# Patient Record
Sex: Male | Born: 1970 | Race: Black or African American | Hispanic: No | Marital: Single | Smoking: Current some day smoker
Health system: Southern US, Community
[De-identification: ages and names within clinical notes are randomized; demographics above are authoritative.]

## PROBLEM LIST (undated history)

## (undated) DIAGNOSIS — I10 Essential (primary) hypertension: Secondary | ICD-10-CM

## (undated) DIAGNOSIS — E7800 Pure hypercholesterolemia, unspecified: Secondary | ICD-10-CM

## (undated) DIAGNOSIS — J45909 Unspecified asthma, uncomplicated: Secondary | ICD-10-CM

## (undated) DIAGNOSIS — E119 Type 2 diabetes mellitus without complications: Secondary | ICD-10-CM

## (undated) DIAGNOSIS — I319 Disease of pericardium, unspecified: Secondary | ICD-10-CM

## (undated) DIAGNOSIS — E079 Disorder of thyroid, unspecified: Secondary | ICD-10-CM

## (undated) HISTORY — PX: VARICOSE VEIN SURGERY: SHX832

---

## 2015-06-23 ENCOUNTER — Encounter (HOSPITAL_BASED_OUTPATIENT_CLINIC_OR_DEPARTMENT_OTHER): Payer: Self-pay

## 2015-06-23 ENCOUNTER — Emergency Department (HOSPITAL_BASED_OUTPATIENT_CLINIC_OR_DEPARTMENT_OTHER)
Admission: EM | Admit: 2015-06-23 | Discharge: 2015-06-23 | Disposition: A | Discharge status: Home / Self Care | Payer: Medicaid - Out of State | Attending: Emergency Medicine | Admitting: Emergency Medicine

## 2015-06-23 ENCOUNTER — Emergency Department (HOSPITAL_BASED_OUTPATIENT_CLINIC_OR_DEPARTMENT_OTHER): Payer: Medicaid - Out of State

## 2015-06-23 DIAGNOSIS — E119 Type 2 diabetes mellitus without complications: Secondary | ICD-10-CM | POA: Diagnosis not present

## 2015-06-23 DIAGNOSIS — J45901 Unspecified asthma with (acute) exacerbation: Secondary | ICD-10-CM | POA: Insufficient documentation

## 2015-06-23 DIAGNOSIS — R0602 Shortness of breath: Secondary | ICD-10-CM | POA: Diagnosis present

## 2015-06-23 DIAGNOSIS — I10 Essential (primary) hypertension: Secondary | ICD-10-CM | POA: Diagnosis not present

## 2015-06-23 DIAGNOSIS — Z880 Allergy status to penicillin: Secondary | ICD-10-CM | POA: Diagnosis not present

## 2015-06-23 DIAGNOSIS — R062 Wheezing: Secondary | ICD-10-CM

## 2015-06-23 HISTORY — DX: Pure hypercholesterolemia, unspecified: E78.00

## 2015-06-23 HISTORY — DX: Essential (primary) hypertension: I10

## 2015-06-23 HISTORY — DX: Type 2 diabetes mellitus without complications: E11.9

## 2015-06-23 HISTORY — DX: Unspecified asthma, uncomplicated: J45.909

## 2015-06-23 HISTORY — DX: Disorder of thyroid, unspecified: E07.9

## 2015-06-23 MED ORDER — CETIRIZINE HCL 10 MG PO CAPS: 1.0000 | ORAL_CAPSULE | Freq: Every day | ORAL | Status: DC

## 2015-06-23 MED ORDER — PREDNISONE 50 MG PO TABS
60.0000 mg | ORAL_TABLET | Freq: Once | ORAL | Status: AC
  Administered 2015-06-23: 60 mg via ORAL
  Filled 2015-06-23 (×2): qty 1

## 2015-06-23 MED ORDER — ALBUTEROL SULFATE (2.5 MG/3ML) 0.083% IN NEBU
INHALATION_SOLUTION | RESPIRATORY_TRACT | Status: AC
  Administered 2015-06-23: 5 mg via RESPIRATORY_TRACT
  Filled 2015-06-23: qty 6

## 2015-06-23 MED ORDER — IPRATROPIUM BROMIDE 0.02 % IN SOLN
RESPIRATORY_TRACT | Status: AC
  Administered 2015-06-23: 0.5 mg via RESPIRATORY_TRACT
  Filled 2015-06-23: qty 2.5

## 2015-06-23 MED ORDER — ALBUTEROL SULFATE HFA 108 (90 BASE) MCG/ACT IN AERS: 1.0000 | INHALATION_SPRAY | Freq: Four times a day (QID) | RESPIRATORY_TRACT | Status: DC | PRN

## 2015-06-23 MED ORDER — FLUTICASONE PROPIONATE HFA 110 MCG/ACT IN AERO: 2.0000 | INHALATION_SPRAY | Freq: Two times a day (BID) | RESPIRATORY_TRACT | Status: DC

## 2015-06-23 MED ORDER — ALBUTEROL SULFATE (2.5 MG/3ML) 0.083% IN NEBU
5.0000 mg | INHALATION_SOLUTION | Freq: Once | RESPIRATORY_TRACT | Status: AC
  Administered 2015-06-23: 5 mg via RESPIRATORY_TRACT
  Filled 2015-06-23: qty 6

## 2015-06-23 MED ORDER — IPRATROPIUM BROMIDE 0.02 % IN SOLN
0.5000 mg | RESPIRATORY_TRACT | Status: AC
  Administered 2015-06-23: 0.5 mg via RESPIRATORY_TRACT

## 2015-06-23 MED ORDER — PREDNISONE 20 MG PO TABS: ORAL_TABLET | ORAL | Status: DC

## 2015-06-23 MED ORDER — ALBUTEROL SULFATE (2.5 MG/3ML) 0.083% IN NEBU
5.0000 mg | INHALATION_SOLUTION | RESPIRATORY_TRACT | Status: AC
  Administered 2015-06-23: 5 mg via RESPIRATORY_TRACT

## 2015-06-23 NOTE — ED Notes (Signed)
Patient transported to X-ray 

## 2015-06-23 NOTE — ED Notes (Signed)
MD at bedside. 

## 2015-06-23 NOTE — Discharge Instructions (Signed)
Asthma, Acute Bronchospasm °Acute bronchospasm caused by asthma is also referred to as an asthma attack. Bronchospasm means your air passages become narrowed. The narrowing is caused by inflammation and tightening of the muscles in the air tubes (bronchi) in your lungs. This can make it hard to breathe or cause you to wheeze and cough. °CAUSES °Possible triggers are: °· Animal dander from the skin, hair, or feathers of animals. °· Dust mites contained in house dust. °· Cockroaches. °· Pollen from trees or grass. °· Mold. °· Cigarette or tobacco smoke. °· Air pollutants such as dust, household cleaners, hair sprays, aerosol sprays, paint fumes, strong chemicals, or strong odors. °· Cold air or weather changes. Cold air may trigger inflammation. Winds increase molds and pollens in the air. °· Strong emotions such as crying or laughing hard. °· Stress. °· Certain medicines such as aspirin or beta-blockers. °· Sulfites in foods and drinks, such as dried fruits and wine. °· Infections or inflammatory conditions, such as a flu, cold, or inflammation of the nasal membranes (rhinitis). °· Gastroesophageal reflux disease (GERD). GERD is a condition where stomach acid backs up into your esophagus. °· Exercise or strenuous activity. °SIGNS AND SYMPTOMS  °· Wheezing. °· Excessive coughing, particularly at night. °· Chest tightness. °· Shortness of breath. °DIAGNOSIS  °Your health care provider will ask you about your medical history and perform a physical exam. A chest X-ray or blood testing may be performed to look for other causes of your symptoms or other conditions that may have triggered your asthma attack.  °TREATMENT  °Treatment is aimed at reducing inflammation and opening up the airways in your lungs.  Most asthma attacks are treated with inhaled medicines. These include quick relief or rescue medicines (such as bronchodilators) and controller medicines (such as inhaled corticosteroids). These medicines are sometimes  given through an inhaler or a nebulizer. Systemic steroid medicine taken by mouth or given through an IV tube also can be used to reduce the inflammation when an attack is moderate or severe. Antibiotic medicines are only used if a bacterial infection is present.  °HOME CARE INSTRUCTIONS  °· Rest. °· Drink plenty of liquids. This helps the mucus to remain thin and be easily coughed up. Only use caffeine in moderation and do not use alcohol until you have recovered from your illness. °· Do not smoke. Avoid being exposed to secondhand smoke. °· You play a critical role in keeping yourself in good health. Avoid exposure to things that cause you to wheeze or to have breathing problems. °· Keep your medicines up-to-date and available. Carefully follow your health care provider's treatment plan. °· Take your medicine exactly as prescribed. °· When pollen or pollution is bad, keep windows closed and use an air conditioner or go to places with air conditioning. °· Asthma requires careful medical care. See your health care provider for a follow-up as advised. If you are more than [redacted] weeks pregnant and you were prescribed any new medicines, let your obstetrician know about the visit and how you are doing. Follow up with your health care provider as directed. °· After you have recovered from your asthma attack, make an appointment with your outpatient doctor to talk about ways to reduce the likelihood of future attacks. If you do not have a doctor who manages your asthma, make an appointment with a primary care doctor to discuss your asthma. °SEEK IMMEDIATE MEDICAL CARE IF:  °· You are getting worse. °· You have trouble breathing. If severe, call your local   emergency services (911 in the [place] °· You develop chest pain or discomfort. °· You are vomiting. °· You are not able to keep fluids down. °· You are coughing up yellow, green, brown, or bloody sputum. °· You have a fever and your symptoms suddenly get worse. °· You have  trouble swallowing. °MAKE SURE YOU:  °· Understand these instructions. °· Will watch your condition. °· Will get help right away if you are not doing well or get worse. °Document Released: 04/01/2007 Document Revised: 12/20/2013 Document Reviewed: 06/22/2013 °ExitCare® Patient Information ©2015 [employer]. This information is not intended to replace advice given to you by your health care provider. Make sure you discuss any questions you have with your health care provider. ° ° °Emergency Department Resource Guide °1) Find a Doctor and Pay Out of Pocket °Although you won't have to find out who is covered by your insurance plan, it is a good idea to ask around and get recommendations. You will then need to call the office and see if the doctor you have chosen will accept you as a new patient and what types of options they offer for patients who are self-pay. Some doctors offer discounts or will set up payment plans for their patients who do not have insurance, but you will need to ask so you aren't surprised when you get to your appointment. ° °2) Contact Your Local Health Department °Not all health departments have doctors that can see patients for sick visits, but many do, so it is worth a call to see if yours does. If you don't know where your local health department is, you can check in your phone book. The CDC also has a tool to help you locate your state's health department, and many state websites also have listings of all of their local health departments. ° °3) Find a Walk-in Clinic °If your illness is not likely to be very severe or complicated, you may want to try a walk in clinic. These are popping up all over the country in pharmacies, drugstores, and shopping centers. They're usually staffed by nurse practitioners or physician assistants that have been trained to treat common illnesses and complaints. They're usually fairly quick and inexpensive. However, if you have serious medical issues or chronic  medical problems, these are probably not your best option. ° °No Primary Care Doctor: °- Call [facility] at  832-8000 - they can help you locate a primary care doctor that  accepts your insurance, provides certain services, etc. °- Physician Referral Service- 1-800-533-3463 ° °Chronic Pain Problems: °Organization         Address  Phone   Notes  °[facility]  (336) 297-2271 Patients need to be referred by their primary care doctor.  ° °Medication Assistance: °Organization         Address  Phone   Notes  °[place] [facility] [address] °[place], [place] [address] (336) 641-8030 --Must be a resident of [place] °-- Must have NO insurance coverage whatsoever (no Medicaid/ Medicare, etc.) °-- The pt. MUST have a primary care doctor that directs their care regularly and follows them in the community °  °[facility]  (866) 331-1348   °[employer]  (888) 892-1162   ° °Agencies that provide inexpensive medical care: °Organization         Address  Phone   Notes  °[facility]  (336) 832-8035   °[facility]    (  336) 832-7272   °[facility] [address] °[place], [place] [address] (336) 832-4777   °[facility] [address], °[place] (336) 271-4999   °[facility]    (336) 373-0678   °[facility]    (336) 272-1050   °[facility] ° [address], [place] Phone:  (336) 832-4444, Fax:  (336) 832-4440 Hours of Operation:  9 am - 6 pm, M-F.  Also accepts Medicaid/Medicare and self-pay.  °[facility] ° [address], [place] Phone: (336) 832-3150, Fax: (336) 832-3151. Hours of Operation:  8:30 am - 5:30 pm, M-F.  Also accepts Medicaid and self-pay.  °[facility] [place] [address], [place] Phone: (336) 878-6027   °[facility] [address], [place], [place] (336)723-1848,  Ext. 123 Mondays & Thursdays: 7-9 AM.  First 15 patients are seen on a first come, first serve basis. °  ° °Medicaid-accepting [place] Providers: ° °Organization         Address  Phone   Notes  °[facility] [address], [place] (336) 641-2100 Also accepts self-pay patients.  °[facility] [address], [place] ° (336) 856-9996   °[facility] [address], [place] (336) 288-8857   °[facility] [address] [place] [address], [place] (336) 299-7000   °Veita Bland [address], [place]  ° (336) 373-1557 Only accepts [place] Access Medicaid patients after they have their name applied to their card.  ° °Self-Pay (no insurance) in [place]: ° °Organization         Address  Phone   Notes  °Sickle Cell Patients, [facility] [address], [place] (336) 832-1970   °[facility] [address], [place] (336) 832-4400   ° [facility] ° [address], [place] (336) 992-4800   °[facility]/Dr. Osei-Bonsu ° [address] [place] [address], [place] or [address], [place] (336) 841-8500 Phone number for both [place] and [place] locations is the same.  °[facility] [address], [place] (336) 299-0000   °[facility] [address] [place] [address],  or [address] (336) 852-7530 °(336) 878-2260   °[facility] [address],  (336) 350-1642, phone; (336) 294-5005, fax Sees patients 1st and 3rd Saturday of every month.  Must not qualify for public or private insurance (i.e. Medicaid, Medicare, [place] Health Choice, Veterans' Benefits) • Household income should be no more than 200% of the poverty level •The clinic cannot treat you if you are pregnant or think you are pregnant • Sexually transmitted diseases are not  treated at the clinic.  ° ° °Dental Care: °Organization         Address  Phone  Notes  °[facility] [address],  (336) 641-6152 Accepts children up to age 21 who are enrolled in Medicaid or [place] Health Choice; pregnant women with a Medicaid card; and children who have applied for Medicaid or [place] Health Choice, but were declined, whose parents can pay a reduced fee at time of service.  °[facility] [place]  [address], [place] (336) 641-7733 Accepts children up   to age 21 who are enrolled in Medicaid or [place] Health Choice; pregnant women with a Medicaid card; and children who have applied for Medicaid or [place] Health Choice, but were declined, whose parents can pay a reduced fee at time of service.  °[facility] ° [address], [place] (336) 641-4533 Patients are seen by appointment only. Walk-ins are not accepted. [facility] will see patients 18 years of age and older. °Monday - Tuesday (8am-5pm) °Most Wednesdays (8:30-5pm) °$30 per visit, cash only  °[facility] ° [address], [place] (336) 641-4533 Patients are seen by appointment only. Walk-ins are not accepted. [facility] will see patients 18 years of age and older. °One Wednesday Evening (Monthly: Volunteer Based).  $30 per visit, cash only  °[facility]  (919) 537-3737 for adults; Children under age 4, call Graduate Pediatric Dentistry at ([phone]) [phone]. Children aged 4-14, please call (919) [phone] to request a pediatric application. ° Dental services are provided in all areas of dental care including fillings, crowns and bridges, complete and partial dentures, implants, gum treatment, root canals, and extractions. Preventive care is also provided. Treatment is provided to both adults and children. °Patients are selected via a lottery and there is  often a waiting list. °  °[facility] [address], °[place] ° (336) 763-8833 [email or web address] °  °[facility] [address], [place], [place] (336)723-1848, Ext. 123 Second and Fourth Thursday of each month, opens at 6:30 AM; Clinic ends at 9 AM.  Patients are seen on a first-come first-served basis, and a limited number are seen during each clinic.  ° °[facility] ° [address], [place], [place] (336) 723-7904   Eligibility Requirements °You must have lived in [place], [place], or [place] counties for at least the last three months. °  You cannot be eligible for state or federal sponsored healthcare insurance, including Veterans Administration, Medicaid, or Medicare. °  You generally cannot be eligible for healthcare insurance through your employer.  °  How to apply: °Eligibility screenings are held every Tuesday and Wednesday afternoon from 1:00 pm until 4:00 pm. You do not need an appointment for the interview!  °[facility] [address], [place], [place] 336-631-2330   °[facility]  336-342-8273   °[facility]  336-703-3100   °[facility]  336-570-6415   ° °Behavioral Health Resources in the Community: °Intensive Outpatient Programs °Organization         Address  Phone  Notes  °[facility] [address], [place], [place] 336-878-6098   °[facility] [address],  [place], [place] 336-832-9800   °[facility] [address], [place], [place] ° 336-882-2125   °[facility] [address],  °[place], [place] 1-800-853-5163 or 336-641-4981   °Substance Abuse Resources °Organization         Address  Phone  Notes  °[facility]  336-882-2125   °[facility]  336-784-9470   °[facility]  336-285-9073   °[facility]  336-845-3988   °Residential & Outpatient Substance  Abuse Program  1-800-659-3381   °Psychological Services °Organization         Address  Phone  Notes  °[facility]  336- 832-9600   °[facility]  336- 378-7881   °[facility]   [address], [place] 1-800-853-5163 or 336-641-4981   ° °Mobile Crisis Teams °Organization         Address  Phone  Notes  °[facility], Mobile Crisis Care Unit  1-877-626-1772   °Assertive °[facility] ° [address] [place], [place] 336-834-9664   °Sharon DeEsch [address] °[place] 336-554-5454   ° °Self-Help/Support Groups °Organization         Address  Phone             Notes  °[facility] - variety of support groups  336- 373-1402 Call for more information  °Narcotics Anonymous (NA), [facility] [address], °[place]  2 meetings at this location  ° °Residential Treatment Programs °Organization         Address  Phone  Notes  °[facility] [address],    °[place]  1-866-801-8205   °[facility] ° [address], [place], [place] 704-293-8524   °[facility] [address], [place] 336-845-3988 Admissions: 8am-3pm M-F  °[facility] [address]    °[place], [place] 336-841-1104   °[facility] [address], [place], [place] 336-379-7146   °[facility] [address]  °[place], [place] 336-285-9073   °[facility] - Intensive Outpatient [address], , [place] 336-852-3033   °[facility] [address]  °[place], [place] 1-877-615-2722 or 336-784-9470   °[facility] [address] [place], [place] 336-227-7417 Accepts Medicaid  °[facility] [address]  ° [place] 1-800-659-3381 Substance Abuse/Addiction Treatment  ° °[place] Behavioral Health Resources °Organization          Address  Phone  Notes  °[facility]  (888) 581-9988   °Julie Brannon, PhD [address] [place], [place]   (336) 349-5553 or (336) 951-0000   °[facility]   [address] °[place], [place] (336) 349-4454   °[facility] [address], [place], [place] (336) 342-8316 Insurance/Medicaid/sponsorship through [facility]  °[facility] [address]                                    [place], [place] (336) 342-8316 Therapy/tele-psych/case  °[facility] [address]  ° ,  (336) 349-2233    °Dr. Arfeen  (336) 349-4544   °[facility] [place]  [employer] [place] [facility] 1) [address],  °2) [address], [place] °3)  [address], [place] (336) 349-3220 °(336) 342-7768 ° °(336) 342-8140   °[place] [facility] (336) 342-1394 or (336) 342-3537 (After Hours)    ° ° ° °

## 2015-06-23 NOTE — ED Notes (Signed)
Pt c/o asthma attack x3 days, states seen at [facility] 2days ago for same and given an inhaler with no relief; pt has audible wheezing, speaking in complete sentences

## 2015-06-23 NOTE — ED Provider Notes (Signed)
CSN: [id number]     Arrival date & time 06/23/15  0405 History   First MD Initiated Contact with Patient 06/23/15 [phone]     Chief Complaint  Patient presents with  . Shortness of Breath     (Consider location/radiation/quality/duration/timing/severity/associated sxs/prior Treatment) Patient is a 44 y.o. male presenting with shortness of breath. The history is provided by the patient.  Shortness of Breath Severity:  Moderate Onset quality:  Gradual Timing:  Constant Progression:  Worsening Chronicity:  Recurrent Context: not URI   Relieved by:  Nothing Worsened by:  Nothing tried Ineffective treatments: inhaler. Associated symptoms: wheezing   Associated symptoms: no abdominal pain, no chest pain, no diaphoresis, no ear pain, no fever, no headaches, no hemoptysis, no PND and no sputum production   Risk factors: no recent alcohol use and no recent surgery   Asthma attack x several days.  Reports given an inhaler at [facility] a few days ago and it's not working.  States they went this evening and were told there were no doctors on duty.    Past Medical History  Diagnosis Date  . Asthma   . Thyroid disease   . Hypertension   . Diabetes mellitus without complication   . High cholesterol    History reviewed. No pertinent past surgical history. History reviewed. No pertinent family history. History  Substance Use Topics  . Smoking status: Never Smoker   . Smokeless tobacco: Not on file  . Alcohol Use: No    Review of Systems  Constitutional: Negative for fever and diaphoresis.  HENT: Negative for ear pain.   Respiratory: Positive for shortness of breath and wheezing. Negative for hemoptysis, sputum production and stridor.   Cardiovascular: Negative for chest pain, palpitations, leg swelling and PND.  Gastrointestinal: Negative for abdominal pain.  Neurological: Negative for headaches.  All other systems reviewed and are negative.     Allergies  Penicillins  Home  Medications   Prior to Admission medications   Not on File   BP 125/63 mmHg  Pulse 96  Temp(Src) 98.1 F (36.7 C) (Oral)  Resp 22  Ht  (1.778 m)  Wt 140 lb (63.504 kg)  BMI 20.09 kg/m2  SpO2 100% Physical Exam  Constitutional: He is oriented to person, place, and time. He appears well-developed and well-nourished. No distress.  HENT:  Head: Normocephalic and atraumatic.  Mouth/Throat: Oropharynx is clear and moist.  Eyes: Conjunctivae are normal. Pupils are equal, round, and reactive to light.  Neck: Normal range of motion. Neck supple.  Cardiovascular: Normal rate, regular rhythm and intact distal pulses.   Pulmonary/Chest: Effort normal. No stridor. No respiratory distress. He has wheezes. He has no rales. He exhibits no tenderness.  Abdominal: Soft. Bowel sounds are normal. There is no tenderness. There is no rebound and no guarding.  Musculoskeletal: Normal range of motion. He exhibits no edema or tenderness.  Lymphadenopathy:    He has no cervical adenopathy.  Neurological: He is alert and oriented to person, place, and time.  Skin: Skin is warm and dry.  Psychiatric: He has a normal mood and affect.    ED Course  Procedures (including critical care time) Labs Review Labs Reviewed - No data to display  Imaging Review Dg Chest 2 View  06/23/2015   CLINICAL DATA:  Acute onset of wheezing and shortness of breath. Initial encounter.  EXAM: CHEST  2 VIEW  COMPARISON:  None.  FINDINGS: The lungs are well-aerated and clear. There is no evidence of  focal opacification, pleural effusion or pneumothorax.  The heart is normal in size; the mediastinal contour is within normal limits. No acute osseous abnormalities are seen.  IMPRESSION: No acute cardiopulmonary process seen.   Electronically Signed   By: Roanna Raider M.D.   On: 06/23/2015 05:03     EKG Interpretation None      MDM   Final diagnoses:  Wheezes  Markedly improved post neb treatment  PERC negative  wells 0 highly doubt PE, symptoms are consistent with asthma attack.  Was reportedly given an inhaler at [facility] and it is not helping.  They denied being given steroid RX.  Will give respurce guide and information regarding pulmonalogist    Gyan Cambre, MD 06/23/15 [phone]

## 2016-07-12 ENCOUNTER — Emergency Department (HOSPITAL_BASED_OUTPATIENT_CLINIC_OR_DEPARTMENT_OTHER): Payer: Medicaid - Out of State

## 2016-07-12 ENCOUNTER — Emergency Department (HOSPITAL_BASED_OUTPATIENT_CLINIC_OR_DEPARTMENT_OTHER)
Admission: EM | Admit: 2016-07-12 | Discharge: 2016-07-12 | Disposition: A | Discharge status: Home / Self Care | Payer: Medicaid - Out of State | Attending: Emergency Medicine | Admitting: Emergency Medicine

## 2016-07-12 ENCOUNTER — Encounter (HOSPITAL_BASED_OUTPATIENT_CLINIC_OR_DEPARTMENT_OTHER): Payer: Self-pay | Admitting: Emergency Medicine

## 2016-07-12 DIAGNOSIS — M542 Cervicalgia: Secondary | ICD-10-CM | POA: Diagnosis not present

## 2016-07-12 DIAGNOSIS — Y939 Activity, unspecified: Secondary | ICD-10-CM | POA: Insufficient documentation

## 2016-07-12 DIAGNOSIS — Y929 Unspecified place or not applicable: Secondary | ICD-10-CM | POA: Diagnosis not present

## 2016-07-12 DIAGNOSIS — S0101XA Laceration without foreign body of scalp, initial encounter: Secondary | ICD-10-CM | POA: Diagnosis not present

## 2016-07-12 DIAGNOSIS — Y999 Unspecified external cause status: Secondary | ICD-10-CM | POA: Diagnosis not present

## 2016-07-12 DIAGNOSIS — E119 Type 2 diabetes mellitus without complications: Secondary | ICD-10-CM | POA: Diagnosis not present

## 2016-07-12 DIAGNOSIS — S0990XA Unspecified injury of head, initial encounter: Secondary | ICD-10-CM

## 2016-07-12 DIAGNOSIS — R55 Syncope and collapse: Secondary | ICD-10-CM | POA: Diagnosis not present

## 2016-07-12 DIAGNOSIS — I10 Essential (primary) hypertension: Secondary | ICD-10-CM | POA: Diagnosis not present

## 2016-07-12 DIAGNOSIS — J45909 Unspecified asthma, uncomplicated: Secondary | ICD-10-CM | POA: Insufficient documentation

## 2016-07-12 MED ORDER — IPRATROPIUM-ALBUTEROL 0.5-2.5 (3) MG/3ML IN SOLN
RESPIRATORY_TRACT | Status: AC
  Filled 2016-07-12: qty 3

## 2016-07-12 MED ORDER — IPRATROPIUM-ALBUTEROL 0.5-2.5 (3) MG/3ML IN SOLN
3.0000 mL | RESPIRATORY_TRACT | Status: AC
  Administered 2016-07-12: 3 mL via RESPIRATORY_TRACT

## 2016-07-12 MED ORDER — LIDOCAINE-EPINEPHRINE 2 %-1:100000 IJ SOLN
20.0000 mL | Freq: Once | INTRAMUSCULAR | Status: AC
  Administered 2016-07-12: 1 mL
  Filled 2016-07-12: qty 1

## 2016-07-12 MED ORDER — NAPROXEN 250 MG PO TABS
500.0000 mg | ORAL_TABLET | Freq: Once | ORAL | Status: AC
  Administered 2016-07-12: 500 mg via ORAL
  Filled 2016-07-12: qty 2

## 2016-07-12 MED ORDER — TETANUS-DIPHTH-ACELL PERTUSSIS 5-2.5-18.5 LF-MCG/0.5 IM SUSP
0.5000 mL | Freq: Once | INTRAMUSCULAR | Status: AC
  Administered 2016-07-12: 0.5 mL via INTRAMUSCULAR
  Filled 2016-07-12: qty 0.5

## 2016-07-12 MED ORDER — ALBUTEROL SULFATE HFA 108 (90 BASE) MCG/ACT IN AERS
1.0000 | INHALATION_SPRAY | RESPIRATORY_TRACT | Status: DC | PRN
  Administered 2016-07-12: 2 via RESPIRATORY_TRACT
  Filled 2016-07-12: qty 6.7

## 2016-07-12 MED ORDER — NAPROXEN 500 MG PO TABS: 500.0000 mg | ORAL_TABLET | Freq: Two times a day (BID) | ORAL | Status: DC

## 2016-07-12 MED ORDER — ALBUTEROL SULFATE HFA 108 (90 BASE) MCG/ACT IN AERS: 1.0000 | INHALATION_SPRAY | Freq: Four times a day (QID) | RESPIRATORY_TRACT | Status: DC | PRN

## 2016-07-12 MED ORDER — OXYCODONE-ACETAMINOPHEN 5-325 MG PO TABS: 1.0000 | ORAL_TABLET | Freq: Once | ORAL | Status: DC
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
## 2016-07-12 NOTE — Discharge Instructions (Signed)
Head Injury, Adult °You have a head injury. Headaches and throwing up (vomiting) are common after a head injury. It should be easy to wake up from sleeping. Sometimes you must stay in the hospital. Most problems happen within the first 24 hours. Side effects may occur up to 7-10 days after the injury.  °WHAT ARE THE TYPES OF HEAD INJURIES? °Head injuries can be as minor as a bump. Some head injuries can be more severe. More severe head injuries include: °· A jarring injury to the brain (concussion). °· A bruise of the brain (contusion). This mean there is bleeding in the brain that can cause swelling. °· A cracked skull (skull fracture). °· Bleeding in the brain that collects, clots, and forms a bump (hematoma). °WHEN SHOULD I GET HELP RIGHT AWAY?  °· You are confused or sleepy. °· You cannot be woken up. °· You feel sick to your stomach (nauseous) or keep throwing up (vomiting). °· Your dizziness or unsteadiness is getting worse. °· You have very bad, lasting headaches that are not helped by medicine. Take medicines only as told by your doctor. °· You cannot use your arms or legs like normal. °· You cannot walk. °· You notice changes in the black spots in the center of the colored part of your eye (pupil). °· You have clear or bloody fluid coming from your nose or ears. °· You have trouble seeing. °During the next 24 hours after the injury, you must stay with someone who can watch you. This person should get help right away (call 911 in the [place] if you start to shake and are not able to control it (have seizures), you pass out, or you are unable to wake up. °HOW CAN I PREVENT A HEAD INJURY IN THE FUTURE? °· Wear seat belts. °· Wear a helmet while bike riding and playing sports like football. °· Stay away from dangerous activities around the house. °WHEN CAN I RETURN TO NORMAL ACTIVITIES AND ATHLETICS? °See your doctor before doing these activities. You should not do normal activities or play contact sports until 1  week after the following symptoms have stopped: °· Headache that does not go away. °· Dizziness. °· Poor attention. °· Confusion. °· Memory problems. °· Sickness to your stomach or throwing up. °· Tiredness. °· Fussiness. °· Bothered by bright lights or loud noises. °· Anxiousness or depression. °· Restless sleep. °MAKE SURE YOU:  °· Understand these instructions. °· Will watch your condition. °· Will get help right away if you are not doing well or get worse. °  °This information is not intended to replace advice given to you by your health care provider. Make sure you discuss any questions you have with your health care provider. °  °Document Released: 11/27/2008 Document Revised: 01/05/2015 Document Reviewed: 08/22/2013 °Elsevier Interactive Patient Education ©2016 Elsevier Inc[employer] ° °Stitches, Staples, or Adhesive Wound Closure °Health care providers use stitches (sutures), staples, and certain glue (skin adhesives) to hold skin together while it heals (wound closure). You may need this treatment after you have surgery or if you cut your skin accidentally. These methods help your skin to heal more quickly and make it less likely that you will have a scar. A wound may take several months to heal completely. °The type of wound you have determines when your wound gets closed. In most cases, the wound is closed as soon as possible (primary skin closure). Sometimes, closure is delayed so the wound can be cleaned and allowed to heal   naturally. This reduces the chance of infection. Delayed closure may be needed if your wound: °· Is caused by a bite. °· Happened more than 6 hours ago. °· Involves loss of skin or the tissues under the skin. °· Has dirt or debris in it that cannot be removed. °· Is infected. °WHAT ARE THE DIFFERENT KINDS OF WOUND CLOSURES? °There are many options for wound closure. The one that your health care provider uses depends on how deep and how large your wound is. °Adhesive Glue °To use this type of  glue to close a wound, your health care provider holds the edges of the wound together and paints the glue on the surface of your skin. You may need more than one layer of glue. Then the wound may be covered with a light bandage (dressing). °This type of skin closure may be used for small wounds that are not deep (superficial). Using glue for wound closure is less painful than other methods. It does not require a medicine that numbs the area (local anesthetic). This method also leaves nothing to be removed. Adhesive glue is often used for children and on facial wounds. °Adhesive glue cannot be used for wounds that are deep, uneven, or bleeding. It is not used inside of a wound.  °Adhesive Strips °These strips are made of sticky (adhesive), porous paper. They are applied across your skin edges like a regular adhesive bandage. You leave them on until they fall off. °Adhesive strips may be used to close very superficial wounds. They may also be used along with sutures to improve the closure of your skin edges.  °Sutures °Sutures are the oldest method of wound closure. Sutures can be made from natural substances, such as silk, or from synthetic materials, such as nylon and steel. They can be made from a material that your body can break down as your wound heals (absorbable), or they can be made from a material that needs to be removed from your skin (nonabsorbable). They come in many different strengths and sizes. °Your health care provider attaches the sutures to a steel needle on one end. Sutures can be passed through your skin, or through the tissues beneath your skin. Then they are tied and cut. Your skin edges may be closed in one continuous stitch or in separate stitches. °Sutures are strong and can be used for all kinds of wounds. Absorbable sutures may be used to close tissues under the skin. The disadvantage of sutures is that they may cause skin reactions that lead to infection. Nonabsorbable sutures need to  be removed. °Staples °When surgical staples are used to close a wound, the edges of your skin on both sides of the wound are brought close together. A staple is placed across the wound, and an instrument secures the edges together. Staples are often used to close surgical cuts (incisions). °Staples are faster to use than sutures, and they cause less skin reaction. Staples need to be removed using a tool that bends the staples away from your skin. °HOW DO I CARE FOR MY WOUND CLOSURE? °· Take medicines only as directed by your health care provider. °· If you were prescribed an antibiotic medicine for your wound, finish it all even if you start to feel better. °· Use ointments or creams only as directed by your health care provider. °· Wash your hands with soap and water before and after touching your wound. °· Do not soak your wound in water. Do not take baths, swim, or   use a hot tub until your health care provider approves. °· Ask your health care provider when you can start showering. Cover your wound if directed by your health care provider. °· Do not take out your own sutures or staples. °· Do not pick at your wound. Picking can cause an infection. °· Keep all follow-up visits as directed by your health care provider. This is important. °HOW LONG WILL I HAVE MY WOUND CLOSURE? °· Leave adhesive glue on your skin until the glue peels away. °· Leave adhesive strips on your skin until the strips fall off. °· Absorbable sutures will dissolve within several days. °· Nonabsorbable sutures and staples must be removed. The location of the wound will determine how long they stay in. This can range from several days to a couple of weeks. °WHEN SHOULD I SEEK HELP FOR MY WOUND CLOSURE? °Contact your health care provider if: °· You have a fever. °· You have chills. °· You have drainage, redness, swelling, or pain at your wound. °· There is a bad smell coming from your wound. °· The skin edges of your wound start to separate  after your sutures have been removed. °· Your wound becomes thick, raised, and darker in color after your sutures come out (scarring). °  °This information is not intended to replace advice given to you by your health care provider. Make sure you discuss any questions you have with your health care provider. °  °Document Released: 09/09/2001 Document Revised: 01/05/2015 Document Reviewed: 05/24/2014 °Elsevier Interactive Patient Education ©2016 Elsevier Inc. ° °

## 2016-11-13 ENCOUNTER — Encounter (HOSPITAL_BASED_OUTPATIENT_CLINIC_OR_DEPARTMENT_OTHER): Payer: Self-pay | Admitting: Emergency Medicine

## 2016-11-13 ENCOUNTER — Emergency Department (HOSPITAL_BASED_OUTPATIENT_CLINIC_OR_DEPARTMENT_OTHER)
Admission: EM | Admit: 2016-11-13 | Discharge: 2016-11-13 | Disposition: A | Discharge status: Home / Self Care | Payer: Medicaid - Out of State | Attending: Emergency Medicine | Admitting: Emergency Medicine

## 2016-11-13 DIAGNOSIS — I10 Essential (primary) hypertension: Secondary | ICD-10-CM | POA: Insufficient documentation

## 2016-11-13 DIAGNOSIS — E119 Type 2 diabetes mellitus without complications: Secondary | ICD-10-CM | POA: Insufficient documentation

## 2016-11-13 DIAGNOSIS — J45909 Unspecified asthma, uncomplicated: Secondary | ICD-10-CM | POA: Insufficient documentation

## 2016-11-13 DIAGNOSIS — Z202 Contact with and (suspected) exposure to infections with a predominantly sexual mode of transmission: Secondary | ICD-10-CM | POA: Insufficient documentation

## 2016-11-13 LAB — URINALYSIS, ROUTINE W REFLEX MICROSCOPIC
BILIRUBIN URINE: NEGATIVE
Glucose, UA: NEGATIVE mg/dL
Hgb urine dipstick: NEGATIVE
Ketones, ur: NEGATIVE mg/dL
NITRITE: NEGATIVE
PH: 6.5 (ref 5.0–8.0)
Protein, ur: NEGATIVE mg/dL
SPECIFIC GRAVITY, URINE: 1.025 (ref 1.005–1.030)

## 2016-11-13 LAB — URINE MICROSCOPIC-ADD ON

## 2016-11-13 MED ORDER — CEFTRIAXONE SODIUM 250 MG IJ SOLR
250.0000 mg | Freq: Once | INTRAMUSCULAR | Status: AC
  Administered 2016-11-13: 250 mg via INTRAMUSCULAR
  Filled 2016-11-13: qty 250

## 2016-11-13 MED ORDER — LIDOCAINE HCL (PF) 1 % IJ SOLN
INTRAMUSCULAR | Status: AC
Start: 2016-11-13 — End: 2016-11-13
  Administered 2016-11-13: 0.9 mL
  Filled 2016-11-13: qty 5

## 2016-11-13 MED ORDER — AZITHROMYCIN 250 MG PO TABS
1000.0000 mg | ORAL_TABLET | Freq: Once | ORAL | Status: AC
  Administered 2016-11-13: 1000 mg via ORAL
  Filled 2016-11-13: qty 4

## 2016-11-13 MED ORDER — METRONIDAZOLE 500 MG PO TABS
2000.0000 mg | ORAL_TABLET | Freq: Once | ORAL | Status: AC
  Administered 2016-11-13: 2000 mg via ORAL
  Filled 2016-11-13: qty 4

## 2016-11-13 NOTE — Discharge Instructions (Signed)
You will be notified if any of the labs drawn by blood or abnormal.  Return for any new or worse symptoms.  Antibiotics given to you today are intended to cover STDs.

## 2016-11-13 NOTE — ED Provider Notes (Signed)
[facility] Provider Note   CSN: [id number] Arrival date & time: 11/13/16  1656  By signing my name below, I, Majel HomerPeyton Lee, attest that this documentation has been prepared under the direction and in the presence of Vanetta MuldersScott Eragon Hammond, MD . Electronically Signed: Majel HomerPeyton Lee, Scribe. 11/13/2016. 6:41 PM.  History   Chief Complaint Chief Complaint  Patient presents with  . Exposure to STD   The history is provided by the patient. No language interpreter was used.   HPI Comments: Benjamin Shields is a 45 y.o. male who presents to the Emergency Department for an evaluation s/p possible exposure to a STD. Pt reports he noticed "dry skin" to the tip of his penis 1 week ago that has remained throughout the week. He denies penile discharge, abnormal rash to his body and dysuria.   Past Medical History:  Diagnosis Date  . Asthma   . Diabetes mellitus without complication (HCC)   . High cholesterol   . Hypertension   . Thyroid disease    There are no active problems to display for this patient.  History reviewed. No pertinent surgical history.  Home Medications    Prior to Admission medications   Medication Sig Start Date End Date Taking? Authorizing Provider  albuterol (PROVENTIL HFA;VENTOLIN HFA) 108 (90 Base) MCG/ACT inhaler Inhale 1-2 puffs into the lungs every 6 (six) hours as needed for wheezing or shortness of breath. 07/12/16   Cheri FowlerKayla Rose, PA-C  Cetirizine HCl 10 MG CAPS Take 1 capsule (10 mg total) by mouth daily. 06/23/15   April Palumbo, MD  fluticasone (FLOVENT HFA) 110 MCG/ACT inhaler Inhale 2 puffs into the lungs 2 (two) times daily. 06/23/15   April Palumbo, MD  naproxen (NAPROSYN) 500 MG tablet Take 1 tablet (500 mg total) by mouth 2 (two) times daily. 07/12/16   Cheri FowlerKayla Rose, PA-C  predniSONE (DELTASONE) 20 MG tablet 3 tabs po day one, then 2 po daily x 4 days 06/23/15   April Palumbo, MD    Family History History reviewed. No pertinent family history.  Social  History Social History  Substance Use Topics  . Smoking status: Never Smoker  . Smokeless tobacco: Never Used  . Alcohol use No   Allergies   Penicillins  Review of Systems Review of Systems  Constitutional: Negative for fever.  HENT: Positive for congestion. Negative for rhinorrhea and sore throat.   Eyes: Negative for visual disturbance.  Respiratory: Positive for cough. Negative for shortness of breath.   Cardiovascular: Negative for chest pain and leg swelling.  Gastrointestinal: Negative for abdominal pain, diarrhea, nausea and vomiting.  Genitourinary: Negative for discharge and dysuria.  Musculoskeletal: Negative for back pain.  Skin: Negative for rash.  Neurological: Negative for headaches.  Hematological: Does not bruise/bleed easily.   Physical Exam Updated Vital Signs BP 123/69 (BP Location: Right Arm)   Pulse 87   Temp 98.5 F (36.9 C)   Resp 18   Ht 5\' 9"  (1.753 m)   Wt 150 lb (68 kg)   SpO2 97%   BMI 22.15 kg/m   Physical Exam  Constitutional: He is oriented to person, place, and time.  HENT:  Head: Normocephalic and atraumatic.  Mouth/Throat: Oropharynx is clear and moist.  Moist mucous membranes   Eyes: EOM are normal. Pupils are equal, round, and reactive to light. No scleral icterus.  Eyes are tracking normally  Cardiovascular: Normal rate, regular rhythm and normal heart sounds.   Pulmonary/Chest: Effort normal and breath sounds normal.  Lungs  clear to auscultation bilaterally   Abdominal: Bowel sounds are normal. There is no tenderness.  Genitourinary:  Genitourinary Comments: Uncircumcised, no discharge, no hernia, no adenopathy, glands penis has a 1 cm area of scarred tissue, no true lesion but he doesn't know if it was a lesion before.   Musculoskeletal: He exhibits no edema, tenderness or deformity.  No swelling in ankles   Neurological: He is alert and oriented to person, place, and time. Coordination normal.  Skin: No rash noted. No  erythema. No pallor.   ED Treatments / Results  Labs (all labs ordered are listed, but only abnormal results are displayed) Labs Reviewed  URINALYSIS, ROUTINE W REFLEX MICROSCOPIC (NOT [facility]) - Abnormal; Notable for the following:       Result Value   Leukocytes, UA TRACE (*)    All other components within normal limits  URINE MICROSCOPIC-ADD ON - Abnormal; Notable for the following:    Squamous Epithelial / LPF 0-5 (*)    Bacteria, UA RARE (*)    All other components within normal limits  RPR  HIV ANTIBODY (ROUTINE TESTING)    EKG  EKG Interpretation None       Radiology No results found.  Procedures Procedures (including critical care time)  Medications Ordered in ED Medications  cefTRIAXone (ROCEPHIN) injection 250 mg (not administered)  azithromycin (ZITHROMAX) tablet 1,000 mg (not administered)  metroNIDAZOLE (FLAGYL) tablet 2,000 mg (not administered)    DIAGNOSTIC STUDIES:  Oxygen Saturation is 97% on RA, normal by my interpretation.    COORDINATION OF CARE:  6:39 PM Discussed treatment plan with pt at bedside and pt agreed to plan.  Initial Impression / Assessment and Plan / ED Course  I have reviewed the triage vital signs and the nursing notes.  Pertinent labs & imaging results that were available during my care of the patient were reviewed by me and considered in my medical decision making (see chart for details).  Clinical Course     The patient without any discharge. But does have a healing sore on the glans of the penis. Not consistent with a chancre. The patient will be treated for possible STD exposure. Patient to receive Rocephin Flagyl and Zithromax. RPR and HIV lab tests sent.  I personally performed the services described in this documentation, which was scribed in my presence. The recorded information has been reviewed and is accurate.   Final Clinical Impressions(s) / ED Diagnoses   Final diagnoses:  Possible exposure to STD     New Prescriptions New Prescriptions   No medications on file     Vanetta MuldersScott Krystal Teachey, MD 11/13/16 1851

## 2016-11-13 NOTE — ED Triage Notes (Signed)
Patient states that he noticed some dry skin to the tip of his penis. He reports that he would like to be checked just in case it is an STD - reports no knowledge of exposor

## 2016-11-13 NOTE — ED Notes (Signed)
Pt given d/c instructions as per chart. Verbalizes understanding. No questions. No reaction to meds noted upon d/c.

## 2016-11-15 LAB — HIV ANTIBODY (ROUTINE TESTING W REFLEX): HIV Screen 4th Generation wRfx: NONREACTIVE

## 2016-11-15 LAB — RPR: RPR Ser Ql: NONREACTIVE

## 2017-06-10 ENCOUNTER — Emergency Department (HOSPITAL_BASED_OUTPATIENT_CLINIC_OR_DEPARTMENT_OTHER): Payer: Medicaid - Out of State

## 2017-06-10 ENCOUNTER — Emergency Department (HOSPITAL_BASED_OUTPATIENT_CLINIC_OR_DEPARTMENT_OTHER)
Admission: EM | Admit: 2017-06-10 | Discharge: 2017-06-10 | Disposition: A | Discharge status: Home / Self Care | Payer: Medicaid - Out of State | Attending: Emergency Medicine | Admitting: Emergency Medicine

## 2017-06-10 ENCOUNTER — Encounter (HOSPITAL_BASED_OUTPATIENT_CLINIC_OR_DEPARTMENT_OTHER): Payer: Self-pay | Admitting: Emergency Medicine

## 2017-06-10 DIAGNOSIS — J9801 Acute bronchospasm: Secondary | ICD-10-CM | POA: Insufficient documentation

## 2017-06-10 DIAGNOSIS — Z791 Long term (current) use of non-steroidal anti-inflammatories (NSAID): Secondary | ICD-10-CM | POA: Insufficient documentation

## 2017-06-10 DIAGNOSIS — F17200 Nicotine dependence, unspecified, uncomplicated: Secondary | ICD-10-CM | POA: Insufficient documentation

## 2017-06-10 DIAGNOSIS — I10 Essential (primary) hypertension: Secondary | ICD-10-CM | POA: Insufficient documentation

## 2017-06-10 DIAGNOSIS — E119 Type 2 diabetes mellitus without complications: Secondary | ICD-10-CM | POA: Insufficient documentation

## 2017-06-10 DIAGNOSIS — Z79899 Other long term (current) drug therapy: Secondary | ICD-10-CM | POA: Insufficient documentation

## 2017-06-10 MED ORDER — ALBUTEROL SULFATE (2.5 MG/3ML) 0.083% IN NEBU
INHALATION_SOLUTION | RESPIRATORY_TRACT | Status: AC
  Filled 2017-06-10: qty 6

## 2017-06-10 MED ORDER — ALBUTEROL SULFATE (2.5 MG/3ML) 0.083% IN NEBU
5.0000 mg | INHALATION_SOLUTION | Freq: Once | RESPIRATORY_TRACT | Status: AC
  Administered 2017-06-10: 5 mg via RESPIRATORY_TRACT

## 2017-06-10 MED ORDER — ALBUTEROL SULFATE HFA 108 (90 BASE) MCG/ACT IN AERS: 2.0000 | INHALATION_SPRAY | RESPIRATORY_TRACT | Status: DC

## 2017-06-10 MED ORDER — DEXAMETHASONE SODIUM PHOSPHATE 10 MG/ML IJ SOLN
10.0000 mg | Freq: Once | INTRAMUSCULAR | Status: AC
  Administered 2017-06-10: 10 mg via INTRAMUSCULAR
  Filled 2017-06-10: qty 1

## 2017-06-10 NOTE — ED Triage Notes (Signed)
Labored Respirations with audible wheezing and retractions.  Sx x2 days.  Pt sts he has used 160 puffs of his inhaler in the past week.  Is now out of med.

## 2017-06-10 NOTE — ED Provider Notes (Signed)
[facility] Provider Note: [name]. Lane Jonnette Nuon, MD, FACEP  CSN: [id number] MRN: [id number] ARRIVAL: 06/10/17 at 0043 ROOM: [facility]   CHIEF COMPLAINT  Shortness of Breath   HISTORY OF PRESENT ILLNESS  Benjamin Shields is a 46 y.o. male with a history of asthma. He states he has been under a lot of stress recently due to his girlfriend using drugs. He is having exacerbation of his asthma for about the past week. He is used his albuterol inhaler excessively, estimating he is used to do 160 times in the past week. He is now out of albuterol. On arrival he estimated his dyspnea to be moderate to severe. He was given 5 milligrams of albuterol neb with improvement. He has had a cough productive of clear sputum. He is having some sternal tenderness which he attributes to coughing. He denies nausea or vomiting but did have diarrhea yesterday.   Past Medical History:  Diagnosis Date  . Asthma   . Diabetes mellitus without complication (HCC)   . High cholesterol   . Hypertension   . Thyroid disease     History reviewed. No pertinent surgical history.  No family history on file.  Social History  Substance Use Topics  . Smoking status: Current Some Day Smoker  . Smokeless tobacco: Never Used  . Alcohol use Yes     Comment: rarely    Prior to Admission medications   Medication Sig Start Date End Date Taking? Authorizing Provider  albuterol (PROVENTIL HFA;VENTOLIN HFA) 108 (90 Base) MCG/ACT inhaler Inhale 1-2 puffs into the lungs every 6 (six) hours as needed for wheezing or shortness of breath. 07/12/16   Cheri Fowlerose, Kayla, PA-C  Cetirizine HCl 10 MG CAPS Take 1 capsule (10 mg total) by mouth daily. 06/23/15   Palumbo, April, MD  fluticasone (FLOVENT HFA) 110 MCG/ACT inhaler Inhale 2 puffs into the lungs 2 (two) times daily. 06/23/15   Palumbo, April, MD  naproxen (NAPROSYN) 500 MG tablet Take 1 tablet (500 mg total) by mouth 2 (two) times daily. 07/12/16   Cheri Fowlerose, Kayla, PA-C  predniSONE  (DELTASONE) 20 MG tablet 3 tabs po day one, then 2 po daily x 4 days 06/23/15   Palumbo, April, MD    Allergies Penicillins   REVIEW OF SYSTEMS  Negative except as noted here or in the History of Present Illness.   PHYSICAL EXAMINATION  Initial Vital Signs Blood pressure 132/86, pulse 87, temperature 98 F (36.7 C), resp. rate 20, height 5\' 9"  (1.753 m), weight 65.8 kg (145 lb), SpO2 98 %.  Examination General: Well-developed, well-nourished male in no acute distress; appearance consistent with age of record HENT: normocephalic; atraumatic Eyes: pupils equal, round and reactive to light; extraocular muscles intact; arcus senilis bilaterally Neck: supple Heart: regular rate and rhythm Lungs: Faint expiratory wheezes in the bases Abdomen: soft; nondistended; nontender; bowel sounds present Extremities: No deformity; full range of motion; pulses normal Neurologic: Awake, alert and oriented; motor function intact in all extremities and symmetric; no facial droop Skin: Warm and dry Psychiatric: Flat affect   RESULTS  Summary of this visit's results, reviewed by myself:   EKG Interpretation  Date/Time:  Wednesday June 10 2017 00:49:50 EDT Ventricular Rate:  94 PR Interval:    QRS Duration: 83 QT Interval:  350 QTC Calculation: 438 R Axis:   87 Text Interpretation:  Sinus rhythm Right atrial enlargement No previous ECGs available Confirmed by Paula LibraMolpus, Jamion Carter ([id number]) on 06/10/2017 2:20:41 AM      Laboratory Studies:  No results found for this or any previous visit (from the past 24 hour(s)). Imaging Studies: Dg Chest 2 View  Result Date: 06/10/2017 CLINICAL DATA:  Labored respiration with wheezing EXAM: CHEST  2 VIEW COMPARISON:  06/23/2015 FINDINGS: Hyperinflation. No focal pulmonary infiltrate, consolidation or effusion. Normal cardiomediastinal silhouette. No pneumothorax. IMPRESSION: Hyperinflation.  No infiltrate or edema Electronically Signed   By: Jasmine Pang M.D.   On:  06/10/2017 01:40    ED COURSE  Nursing notes and initial vitals signs, including pulse oximetry, reviewed.  Vitals:   06/10/17 0049 06/10/17 0051  BP: 132/86   Pulse: 87   Resp: 20   Temp: 98 F (36.7 C)   SpO2: 98%   Weight:  65.8 kg (145 lb)  Height:  5\' 9"  (1.753 m)   2:26 AM We will provide the patient with a new inhaler with an AeroChamber and instructed minute use.  PROCEDURES    ED DIAGNOSES     ICD-10-CM   1. Acute bronchospasm J98.01        Elyana Grabski, Jonny Ruiz, MD 06/10/17 [phone]

## 2017-07-03 ENCOUNTER — Emergency Department (HOSPITAL_BASED_OUTPATIENT_CLINIC_OR_DEPARTMENT_OTHER)
Admission: EM | Admit: 2017-07-03 | Discharge: 2017-07-03 | Disposition: A | Discharge status: Home / Self Care | Payer: Self-pay | Attending: Emergency Medicine | Admitting: Emergency Medicine

## 2017-07-03 ENCOUNTER — Encounter (HOSPITAL_BASED_OUTPATIENT_CLINIC_OR_DEPARTMENT_OTHER): Payer: Self-pay | Admitting: Emergency Medicine

## 2017-07-03 ENCOUNTER — Emergency Department (HOSPITAL_BASED_OUTPATIENT_CLINIC_OR_DEPARTMENT_OTHER): Payer: Self-pay

## 2017-07-03 DIAGNOSIS — Z79899 Other long term (current) drug therapy: Secondary | ICD-10-CM | POA: Insufficient documentation

## 2017-07-03 DIAGNOSIS — E119 Type 2 diabetes mellitus without complications: Secondary | ICD-10-CM | POA: Insufficient documentation

## 2017-07-03 DIAGNOSIS — I10 Essential (primary) hypertension: Secondary | ICD-10-CM | POA: Insufficient documentation

## 2017-07-03 DIAGNOSIS — J45909 Unspecified asthma, uncomplicated: Secondary | ICD-10-CM | POA: Insufficient documentation

## 2017-07-03 DIAGNOSIS — B9789 Other viral agents as the cause of diseases classified elsewhere: Secondary | ICD-10-CM | POA: Insufficient documentation

## 2017-07-03 DIAGNOSIS — F17200 Nicotine dependence, unspecified, uncomplicated: Secondary | ICD-10-CM | POA: Insufficient documentation

## 2017-07-03 DIAGNOSIS — J069 Acute upper respiratory infection, unspecified: Secondary | ICD-10-CM | POA: Insufficient documentation

## 2017-07-03 NOTE — Discharge Instructions (Signed)
There does not appear to be an emergent cause for your symptoms at this time. Your exam and x-rays were reassuring. He may consider trying over-the-counter cold medicine like Mucinex or Delsym to help with cough. He may also try Tylenol and ibuprofen for body aches and fever. Follow-up with your doctor for reevaluation. Return to ED for new or worsening symptoms as we discussed.

## 2017-07-03 NOTE — ED Provider Notes (Signed)
[facility]   CSN: [id number] Arrival date & time: 07/03/17  1212     History   Chief Complaint Chief Complaint  Patient presents with  . Cough    HPI Benjamin Shields is a 46 y.o. male.  HPI history of asthma, here for evaluation of cough. Patient reports for the past 3 days he has had a cough that is intermittently productive with associated right-sided ear pain. He reports associated tactile unmeasured fever and body aches at home. He has been using his albuterol inhaler without relief. No other remedies or interventions try to improve symptoms. No overt shortness of breath or chest pain. No other modifying factors  Past Medical History:  Diagnosis Date  . Asthma   . Diabetes mellitus without complication (HCC)   . High cholesterol   . Hypertension   . Thyroid disease     There are no active problems to display for this patient.   History reviewed. No pertinent surgical history.     Home Medications    Prior to Admission medications   Medication Sig Start Date End Date Taking? Authorizing Provider  albuterol (PROVENTIL HFA;VENTOLIN HFA) 108 (90 Base) MCG/ACT inhaler Inhale 1-2 puffs into the lungs every 6 (six) hours as needed for wheezing or shortness of breath. 07/12/16   Cheri Fowlerose, Kayla, PA-C  Cetirizine HCl 10 MG CAPS Take 1 capsule (10 mg total) by mouth daily. 06/23/15   Palumbo, April, MD  fluticasone (FLOVENT HFA) 110 MCG/ACT inhaler Inhale 2 puffs into the lungs 2 (two) times daily. 06/23/15   Palumbo, April, MD  naproxen (NAPROSYN) 500 MG tablet Take 1 tablet (500 mg total) by mouth 2 (two) times daily. 07/12/16   Cheri Fowlerose, Kayla, PA-C    Family History History reviewed. No pertinent family history.  Social History Social History  Substance Use Topics  . Smoking status: Current Some Day Smoker  . Smokeless tobacco: Never Used  . Alcohol use Yes     Comment: rarely     Allergies   Penicillins   Review of Systems Review of  Systems See history of present illness  Physical Exam Updated Vital Signs BP 109/72 (BP Location: Left Arm)   Pulse 68   Temp 98.7 F (37.1 C) (Oral)   Resp 18   Ht 5\' 9"  (1.753 m)   Wt 65.8 kg (145 lb)   SpO2 100%   BMI 21.41 kg/m   Physical Exam  Constitutional: He appears well-developed. No distress.  Awake, alert and nontoxic in appearance  HENT:  Head: Normocephalic and atraumatic.  Right Ear: External ear normal.  Left Ear: External ear normal.  Mouth/Throat: Oropharynx is clear and moist.  Eyes: Conjunctivae and EOM are normal. Pupils are equal, round, and reactive to light.  Neck: Normal range of motion. No JVD present.  Cardiovascular: Normal rate, regular rhythm and normal heart sounds.   Pulmonary/Chest: Effort normal. No stridor.  Minimal expiratory wheezes on right side.  Abdominal: Soft. There is no tenderness.  Musculoskeletal: Normal range of motion.  Neurological:  Awake, alert, cooperative and aware of situation; motor strength bilaterally; sensation normal to light touch bilaterally; no facial asymmetry; tongue midline; major cranial nerves appear intact;  baseline gait without new ataxia.  Skin: No rash noted. He is not diaphoretic.  Psychiatric: He has a normal mood and affect. His behavior is normal. Thought content normal.  Nursing note and vitals reviewed.    ED Treatments / Results  Labs (all labs ordered are listed,  but only abnormal results are displayed) Labs Reviewed - No data to display  EKG  EKG Interpretation None      Vitals:   07/03/17 1218  BP: 109/72  Pulse: 68  Resp: 18  Temp: 98.7 F (37.1 C)  TempSrc: Oral  SpO2: 100%  Weight: 65.8 kg (145 lb)  Height: 5\' 9"  (1.753 m)    Radiology Dg Chest 2 View  Result Date: 07/03/2017 CLINICAL DATA:  Cough and congestion. EXAM: CHEST  2 VIEW COMPARISON:  06/10/2017 . FINDINGS: Mediastinum and hilar structures normal. Lungs are clear. No pleural effusion or pneumothorax.  Biapical pleural-parenchymal thickening noted consistent scarring . IMPRESSION: No acute abnormality. Electronically Signed   By: Maisie Fus  Register   On: 07/03/2017 13:29    Procedures Procedures (including critical care time)  Medications Ordered in ED Medications - No data to display   Initial Impression / Assessment and Plan / ED Course  I have reviewed the triage vital signs and the nursing notes.  Pertinent labs & imaging results that were available during my care of the patient were reviewed by me and considered in my medical decision making (see chart for details).     Pt CXR negative for acute infiltrate. Patients symptoms are consistent with URI, likely viral etiology. Discussed that antibiotics are not indicated for viral infections. Pt will be discharged with symptomatic treatment.  Verbalizes understanding and is agreeable with plan. Pt is hemodynamically stable & in NAD prior to dc.   Final Clinical Impressions(s) / ED Diagnoses   Final diagnoses:  Viral URI with cough    New Prescriptions New Prescriptions   No medications on file     Joycie Peek, Cordelia Poche 07/03/17 1429    Melene Plan, DO 07/03/17 1507

## 2017-07-03 NOTE — ED Triage Notes (Addendum)
Patient reports fever, headache, productive cough and body aches x 2 days.  Patient alert and oriented in NAD at triage.

## 2017-10-26 ENCOUNTER — Emergency Department (HOSPITAL_BASED_OUTPATIENT_CLINIC_OR_DEPARTMENT_OTHER)
Admission: EM | Admit: 2017-10-26 | Discharge: 2017-10-26 | Disposition: A | Discharge status: Home / Self Care | Payer: Self-pay | Attending: Emergency Medicine | Admitting: Emergency Medicine

## 2017-10-26 ENCOUNTER — Encounter (HOSPITAL_BASED_OUTPATIENT_CLINIC_OR_DEPARTMENT_OTHER): Payer: Self-pay | Admitting: Emergency Medicine

## 2017-10-26 DIAGNOSIS — Z79899 Other long term (current) drug therapy: Secondary | ICD-10-CM | POA: Insufficient documentation

## 2017-10-26 DIAGNOSIS — Z791 Long term (current) use of non-steroidal anti-inflammatories (NSAID): Secondary | ICD-10-CM | POA: Insufficient documentation

## 2017-10-26 DIAGNOSIS — I10 Essential (primary) hypertension: Secondary | ICD-10-CM | POA: Insufficient documentation

## 2017-10-26 DIAGNOSIS — K0889 Other specified disorders of teeth and supporting structures: Secondary | ICD-10-CM | POA: Insufficient documentation

## 2017-10-26 DIAGNOSIS — E119 Type 2 diabetes mellitus without complications: Secondary | ICD-10-CM | POA: Insufficient documentation

## 2017-10-26 DIAGNOSIS — J45909 Unspecified asthma, uncomplicated: Secondary | ICD-10-CM | POA: Insufficient documentation

## 2017-10-26 DIAGNOSIS — F17200 Nicotine dependence, unspecified, uncomplicated: Secondary | ICD-10-CM | POA: Insufficient documentation

## 2017-10-26 MED ORDER — AMOXICILLIN 500 MG PO CAPS: 1000.0000 mg | ORAL_CAPSULE | Freq: Two times a day (BID) | ORAL | 0 refills | Status: DC

## 2017-10-26 MED ORDER — METRONIDAZOLE 500 MG PO TABS
500.0000 mg | ORAL_TABLET | Freq: Once | ORAL | Status: AC
  Administered 2017-10-26: 500 mg via ORAL
  Filled 2017-10-26: qty 1

## 2017-10-26 MED ORDER — HYDROCODONE-ACETAMINOPHEN 5-325 MG PO TABS: 1.0000 | ORAL_TABLET | Freq: Once | ORAL | Status: DC

## 2017-10-26 MED ORDER — METRONIDAZOLE 500 MG PO TABS: 500.0000 mg | ORAL_TABLET | Freq: Two times a day (BID) | ORAL | 0 refills | Status: DC

## 2017-10-26 MED ORDER — HYDROCODONE-ACETAMINOPHEN 5-325 MG PO TABS: ORAL_TABLET | ORAL | 0 refills | Status: DC

## 2017-10-26 MED ORDER — AMOXICILLIN 500 MG PO CAPS
500.0000 mg | ORAL_CAPSULE | Freq: Once | ORAL | Status: AC
  Administered 2017-10-26: 500 mg via ORAL
  Filled 2017-10-26: qty 1

## 2017-10-26 NOTE — ED Notes (Signed)
No vicodin given to to no ride

## 2017-10-26 NOTE — Discharge Instructions (Signed)
For pain control please take ibuprofen (also known as Motrin or Advil) 800mg  (this is normally 4 over the counter pills) 3 times a day  for 5 days. Take with food to minimize stomach irritation.  Take vicodin for breakthrough pain, do not drink alcohol, drive, care for children or do other critical tasks while taking vicodin.   Take your antibiotics as directed and to completion. You should never have any leftover antibiotics! Push fluids and stay well hydrated.   Do not drink alcohol while you are taking flagyl (metronidazole) because it will make you very sick.  Please follow with your primary care doctor in the next 2 days for a check-up. They must obtain records for further management.   Do not hesitate to return to the Emergency Department for any new, worsening or concerning symptoms.

## 2017-10-26 NOTE — ED Provider Notes (Signed)
[facility] EMERGENCY DEPARTMENT Provider Note   CSN: [id number] Arrival date & time: 10/26/17  1524     History   Chief Complaint Chief Complaint  Patient presents with  . Dental Pain    HPI   Blood pressure 116/69, pulse 73, temperature 98.6 F (37 C), temperature source Oral, resp. rate 18, height 5\' 10"  (1.778 m), weight 65.8 kg (145 lb), SpO2 100 %.  Aziz Slape is a 46 y.o. male complaining of pain to the left upper tooth area with foul taste worsening over the course of last 2 days.  He states he has been taking amoxicillin intermittently only when he has pain, he has an appointment with a dentist in January.  He denies any fever, chills, swelling, difficulty swallowing, increase in blood sugar.  Past Medical History:  Diagnosis Date  . Asthma   . Diabetes mellitus without complication (HCC)   . High cholesterol   . Hypertension   . Thyroid disease     There are no active problems to display for this patient.   History reviewed. No pertinent surgical history.     Home Medications    Prior to Admission medications   Medication Sig Start Date End Date Taking? Authorizing Provider  albuterol (PROVENTIL HFA;VENTOLIN HFA) 108 (90 Base) MCG/ACT inhaler Inhale 1-2 puffs into the lungs every 6 (six) hours as needed for wheezing or shortness of breath. 07/12/16   Cheri Fowler, PA-C  Cetirizine HCl 10 MG CAPS Take 1 capsule (10 mg total) by mouth daily. 06/23/15   Palumbo, April, MD  fluticasone (FLOVENT HFA) 110 MCG/ACT inhaler Inhale 2 puffs into the lungs 2 (two) times daily. 06/23/15   Palumbo, April, MD  naproxen (NAPROSYN) 500 MG tablet Take 1 tablet (500 mg total) by mouth 2 (two) times daily. 07/12/16   Cheri Fowler, PA-C    Family History History reviewed. No pertinent family history.  Social History Social History  Substance Use Topics  . Smoking status: Current Some Day Smoker  . Smokeless tobacco: Never Used  . Alcohol use Yes     Comment:  rarely     Allergies   Penicillins   Review of Systems Review of Systems  A complete review of systems was obtained and all systems are negative except as noted in the HPI and PMH.    Physical Exam Updated Vital Signs BP 116/69 (BP Location: Left Arm)   Pulse 73   Temp 98.6 F (37 C) (Oral)   Resp 18   Ht 5\' 10"  (1.778 m)   Wt 65.8 kg (145 lb)   SpO2 100%   BMI 20.81 kg/m   Physical Exam  Constitutional: He is oriented to person, place, and time. He appears well-developed and well-nourished. No distress.  HENT:  Head: Normocephalic.  Mouth/Throat: Oropharynx is clear and moist.  Generally poor dentition, no gingival swelling, erythema or tenderness to palpation. Patient is handling their secretions. There is no tenderness to palpation or firmness underneath tongue bilaterally. No trismus.    Eyes: Pupils are equal, round, and reactive to light. Conjunctivae and EOM are normal.  Neck: Normal range of motion.  Cardiovascular: Normal rate.   Pulmonary/Chest: Effort normal. No stridor.  Abdominal: Soft.  Musculoskeletal: Normal range of motion.  Lymphadenopathy:    He has no cervical adenopathy.  Neurological: He is alert and oriented to person, place, and time.  Psychiatric: He has a normal mood and affect.  Nursing note and vitals reviewed.    ED Treatments /  Results  Labs (all labs ordered are listed, but only abnormal results are displayed) Labs Reviewed - No data to display  EKG  EKG Interpretation None       Radiology No results found.  Procedures Procedures (including critical care time)  Medications Ordered in ED Medications  amoxicillin (AMOXIL) capsule 500 mg (not administered)  metroNIDAZOLE (FLAGYL) tablet 500 mg (not administered)  HYDROcodone-acetaminophen (NORCO/VICODIN) 5-325 MG per tablet 1 tablet (not administered)     Initial Impression / Assessment and Plan / ED Course  I have reviewed the triage vital signs and the nursing  notes.  Pertinent labs & imaging results that were available during my care of the patient were reviewed by me and considered in my medical decision making (see chart for details).     Vitals:   10/26/17 1537 10/26/17 1539  BP:  116/69  Pulse:  73  Resp:  18  Temp:  98.6 F (37 C)  TempSrc:  Oral  SpO2:  100%  Weight: 65.8 kg (145 lb)   Height: 5\' 10"  (1.778 m)     Medications  amoxicillin (AMOXIL) capsule 500 mg (not administered)  metroNIDAZOLE (FLAGYL) tablet 500 mg (not administered)  HYDROcodone-acetaminophen (NORCO/VICODIN) 5-325 MG per tablet 1 tablet (not administered)    Ouida Sillsndre France is 46 y.o. male presenting with dental pain associated with dental caries but no signs or symptoms of dental abscess.  Advised patient on appropriate way to take antibiotics and that means starting the course and finishing it off completely.  Not taking it intermittently only when he has pain.  Patient verbalized understanding teach back technique.  Patient afebrile, non toxic appearing and swallowing secretions well. I gave patient referral to dentist and stressed the importance of dental follow up for definitive management of dental issues. Patient voices understanding and is agreeable to plan.   Evaluation does not show pathology that would require ongoing emergent intervention or inpatient treatment. Pt is hemodynamically stable and mentating appropriately. Discussed findings and plan with patient/guardian, who agrees with care plan. All questions answered. Return precautions discussed and outpatient follow up given.      Final Clinical Impressions(s) / ED Diagnoses   Final diagnoses:  Pain, dental    New Prescriptions New Prescriptions   No medications on file     Kaylyn Limisciotta, Hollace Michelli, PA-C 10/26/17 1613    Vanetta MuldersZackowski, Scott, MD 10/27/17 [phone]

## 2017-10-26 NOTE — ED Triage Notes (Signed)
Patient states that he has bilateral teeth pain. Reports that he has had these teeth hurt for a while, has a dental appointment soon

## 2018-01-14 ENCOUNTER — Encounter (HOSPITAL_BASED_OUTPATIENT_CLINIC_OR_DEPARTMENT_OTHER): Payer: Self-pay | Admitting: *Deleted

## 2018-01-14 ENCOUNTER — Other Ambulatory Visit: Payer: Self-pay

## 2018-01-14 ENCOUNTER — Emergency Department (HOSPITAL_BASED_OUTPATIENT_CLINIC_OR_DEPARTMENT_OTHER)
Admission: EM | Admit: 2018-01-14 | Discharge: 2018-01-14 | Disposition: A | Discharge status: Home / Self Care | Payer: Self-pay | Attending: Emergency Medicine | Admitting: Emergency Medicine

## 2018-01-14 DIAGNOSIS — Z79899 Other long term (current) drug therapy: Secondary | ICD-10-CM | POA: Insufficient documentation

## 2018-01-14 DIAGNOSIS — J45909 Unspecified asthma, uncomplicated: Secondary | ICD-10-CM | POA: Insufficient documentation

## 2018-01-14 DIAGNOSIS — E119 Type 2 diabetes mellitus without complications: Secondary | ICD-10-CM | POA: Insufficient documentation

## 2018-01-14 DIAGNOSIS — Z113 Encounter for screening for infections with a predominantly sexual mode of transmission: Secondary | ICD-10-CM | POA: Insufficient documentation

## 2018-01-14 DIAGNOSIS — Z711 Person with feared health complaint in whom no diagnosis is made: Secondary | ICD-10-CM | POA: Insufficient documentation

## 2018-01-14 DIAGNOSIS — F17200 Nicotine dependence, unspecified, uncomplicated: Secondary | ICD-10-CM | POA: Insufficient documentation

## 2018-01-14 DIAGNOSIS — I10 Essential (primary) hypertension: Secondary | ICD-10-CM | POA: Insufficient documentation

## 2018-01-14 DIAGNOSIS — R360 Urethral discharge without blood: Secondary | ICD-10-CM | POA: Insufficient documentation

## 2018-01-14 LAB — URINALYSIS, ROUTINE W REFLEX MICROSCOPIC
BILIRUBIN URINE: NEGATIVE
GLUCOSE, UA: NEGATIVE mg/dL
HGB URINE DIPSTICK: NEGATIVE
Ketones, ur: NEGATIVE mg/dL
Leukocytes, UA: NEGATIVE
Nitrite: NEGATIVE
Protein, ur: NEGATIVE mg/dL
SPECIFIC GRAVITY, URINE: 1.025 (ref 1.005–1.030)
pH: 7 (ref 5.0–8.0)

## 2018-01-14 MED ORDER — AZITHROMYCIN 250 MG PO TABS
1000.0000 mg | ORAL_TABLET | Freq: Once | ORAL | Status: AC
  Administered 2018-01-14: 1000 mg via ORAL
  Filled 2018-01-14: qty 4

## 2018-01-14 MED ORDER — LIDOCAINE HCL (PF) 1 % IJ SOLN
INTRAMUSCULAR | Status: AC
  Administered 2018-01-14: 1.2 mL
  Filled 2018-01-14: qty 5

## 2018-01-14 MED ORDER — CEFTRIAXONE SODIUM 250 MG IJ SOLR
250.0000 mg | Freq: Once | INTRAMUSCULAR | Status: AC
  Administered 2018-01-14: 250 mg via INTRAMUSCULAR
  Filled 2018-01-14: qty 250

## 2018-01-14 NOTE — ED Provider Notes (Signed)
[facility] EMERGENCY DEPARTMENT Provider Note   CSN: [id number] Arrival date & time: 01/14/18  1022     History   Chief Complaint Chief Complaint  Patient presents with  . Exposure to STD    HPI Benjamin Shields is a 47 y.o. male with a hx of tobacco use, DM, HTN, and hyperlipidemia who presents to the ED complaining of penile discharge over the past 3 days.  Patient states he has had intermittent discharge described as clear to gray in color, no alleviating/aggravating factors.  States he was sexually active with a new partner 1 week ago. Requesting STD screen- declining HIV/syphilis. Denies fever, chills, nausea, vomiting, abdominal pain, dysuria, testicular pain/swelling, or pain with bowel movements.  HPI  Past Medical History:  Diagnosis Date  . Asthma   . Diabetes mellitus without complication (HCC)   . High cholesterol   . Hypertension   . Thyroid disease     There are no active problems to display for this patient.   History reviewed. No pertinent surgical history.     Home Medications    Prior to Admission medications   Medication Sig Start Date End Date Taking? Authorizing Provider  albuterol (PROVENTIL HFA;VENTOLIN HFA) 108 (90 Base) MCG/ACT inhaler Inhale 1-2 puffs into the lungs every 6 (six) hours as needed for wheezing or shortness of breath. 07/12/16   Cheri Fowlerose, Kayla, PA-C  amoxicillin (AMOXIL) 500 MG capsule Take 2 capsules (1,000 mg total) by mouth 2 (two) times daily. 10/26/17   Pisciotta, Joni ReiningNicole, PA-C  Cetirizine HCl 10 MG CAPS Take 1 capsule (10 mg total) by mouth daily. 06/23/15   Palumbo, April, MD  fluticasone (FLOVENT HFA) 110 MCG/ACT inhaler Inhale 2 puffs into the lungs 2 (two) times daily. 06/23/15   Palumbo, April, MD  HYDROcodone-acetaminophen (NORCO/VICODIN) 5-325 MG tablet Take 1-2 tablets by mouth every 6 hours as needed for pain and/or cough. 10/26/17   Pisciotta, Joni ReiningNicole, PA-C  metroNIDAZOLE (FLAGYL) 500 MG tablet Take 1 tablet (500  mg total) by mouth 2 (two) times daily. One tab PO bid x 10 days 10/26/17   Pisciotta, Joni ReiningNicole, PA-C  naproxen (NAPROSYN) 500 MG tablet Take 1 tablet (500 mg total) by mouth 2 (two) times daily. 07/12/16   Cheri Fowlerose, Kayla, PA-C    Family History History reviewed. No pertinent family history.  Social History Social History   Tobacco Use  . Smoking status: Current Some Day Smoker  . Smokeless tobacco: Never Used  Substance Use Topics  . Alcohol use: Yes    Comment: rarely  . Drug use: Yes    Frequency: 3.0 times per week    Types: Marijuana     Allergies   Penicillins   Review of Systems Review of Systems  Constitutional: Negative for chills and fever.  Gastrointestinal: Negative for abdominal pain, blood in stool, constipation, nausea, rectal pain and vomiting.  Genitourinary: Positive for discharge. Negative for difficulty urinating, dysuria, flank pain, frequency, genital sores, hematuria, penile pain, penile swelling, scrotal swelling, testicular pain and urgency.     Physical Exam Updated Vital Signs BP 122/82 (BP Location: Right Arm)   Pulse 89   Temp 98.2 F (36.8 C) (Oral)   Resp 18   Ht 5\' 9"  (1.753 m)   Wt 70.3 kg (155 lb)   SpO2 100%   BMI 22.89 kg/m   Physical Exam  Constitutional: He appears well-developed and well-nourished.  Non-toxic appearance. No distress.  HENT:  Head: Normocephalic and atraumatic.  Eyes: Conjunctivae are normal.  Right eye exhibits no discharge. Left eye exhibits no discharge.  Cardiovascular: Normal rate and regular rhythm.  Pulmonary/Chest: Effort normal and breath sounds normal. No respiratory distress.  Abdominal: Soft. Normal appearance. He exhibits no distension. There is no tenderness. There is no rigidity, no rebound, no guarding and no CVA tenderness.  Genitourinary: Penis normal. Cremasteric reflex is present. Right testis shows no mass, no swelling and no tenderness. Left testis shows no mass, no swelling and no tenderness.  Circumcised. No penile erythema or penile tenderness. No discharge found.  Genitourinary Comments: RN Cordie Grice present as chaperone  Neurological: He is alert.  Clear speech.   Psychiatric: He has a normal mood and affect. His behavior is normal. Thought content normal.  Nursing note and vitals reviewed.   ED Treatments / Results  Labs Results for orders placed or performed during the hospital encounter of 01/14/18  Urinalysis, Routine w reflex microscopic  Result Value Ref Range   Color, Urine YELLOW YELLOW   APPearance CLEAR CLEAR   Specific Gravity, Urine 1.025 1.005 - 1.030   pH 7.0 5.0 - 8.0   Glucose, UA NEGATIVE NEGATIVE mg/dL   Hgb urine dipstick NEGATIVE NEGATIVE   Bilirubin Urine NEGATIVE NEGATIVE   Ketones, ur NEGATIVE NEGATIVE mg/dL   Protein, ur NEGATIVE NEGATIVE mg/dL   Nitrite NEGATIVE NEGATIVE   Leukocytes, UA NEGATIVE NEGATIVE   No results found. EKG  EKG Interpretation None      Radiology No results found.  Procedures Procedures (including critical care time)  Medications Ordered in ED Medications  azithromycin (ZITHROMAX) tablet 1,000 mg (1,000 mg Oral Given 01/14/18 1113)  cefTRIAXone (ROCEPHIN) injection 250 mg (250 mg Intramuscular Given 01/14/18 1114)  lidocaine (PF) (XYLOCAINE) 1 % injection (1.2 mLs  Given 01/14/18 1114)     Initial Impression / Assessment and Plan / ED Course  I have reviewed the triage vital signs and the nursing notes.  Pertinent labs & imaging results that were available during my care of the patient were reviewed by me and considered in my medical decision making (see chart for details).    Patient presents with penile discharge and concern for STD screen. Patient afebrile without abdominal tenderness, abdominal pain or painful bowel movements to indicate prostatitis.  No tenderness to palpation of the testes or epididymis to suggest orchitis or epididymitis.  STD cultures obtained including gonorrhea and chlamydia,  offered syphilis and HIV testing- patient declined. Patient to be discharged with instructions to follow up with PCP. Discussed importance of using protection when sexually active. Patient understands that they have GC/Chlamydia cultures pending and that they will need to inform all sexual partners if results return positive. Patient has been treated prophylactically with Azithromycin and Rocephin- discussed PCN allergy, patient said he got hives, but has been able to tolerate Amoxicillin without problems. I discussed  treatment plan, need for PCP follow-up, and return precautions with the patient. Provided opportunity for questions, patient confirmed understanding and is in agreement with plan.   Final Clinical Impressions(s) / ED Diagnoses   Final diagnoses:  Concern about STD in male without diagnosis    ED Discharge Orders    None       Cherly Anderson, PA-C 01/14/18 1125    Tilden Fossa, MD 01/15/18 [phone]

## 2018-01-14 NOTE — Discharge Instructions (Signed)
Seen in the emergency department today and screen for sexually transmitted infection including gonorrhea and chlamydia.  You were treated for these conditions with antibiotics in the emergency department prophylactically.   You will receive a call to inform you of these results.  If these results are positive you will need to inform your sexual partners so they are able to get evaluated and potentially treated if necessary.  Follow up with your primary care doctor or with the health department in 1 week.  Return to the emergency department for any new or worsening symptoms including but not limited to fever, chills, abdominal pain, pain with bowel movements, or worsening of your current symptoms.  Or any other concerns.

## 2018-01-14 NOTE — ED Notes (Signed)
ED Provider at bedside. 

## 2018-01-14 NOTE — ED Triage Notes (Signed)
Pt reports "I had really wild sex on Monday, and since then I've had clear leaking, like pre cum. I tested it myself and it was pre cum, but the health department was too busy yesterday so I came here because I want to be sure." denies pain.

## 2018-01-15 LAB — GC/CHLAMYDIA PROBE AMP (~~LOC~~) NOT AT ARMC
CHLAMYDIA, DNA PROBE: NEGATIVE
NEISSERIA GONORRHEA: NEGATIVE

## 2018-09-01 ENCOUNTER — Other Ambulatory Visit: Payer: Self-pay

## 2018-09-01 ENCOUNTER — Emergency Department (HOSPITAL_BASED_OUTPATIENT_CLINIC_OR_DEPARTMENT_OTHER)
Admission: EM | Admit: 2018-09-01 | Discharge: 2018-09-01 | Disposition: A | Discharge status: Home / Self Care | Payer: Self-pay | Attending: Emergency Medicine | Admitting: Emergency Medicine

## 2018-09-01 ENCOUNTER — Encounter (HOSPITAL_BASED_OUTPATIENT_CLINIC_OR_DEPARTMENT_OTHER): Payer: Self-pay

## 2018-09-01 DIAGNOSIS — I10 Essential (primary) hypertension: Secondary | ICD-10-CM | POA: Insufficient documentation

## 2018-09-01 DIAGNOSIS — S80812A Abrasion, left lower leg, initial encounter: Secondary | ICD-10-CM | POA: Insufficient documentation

## 2018-09-01 DIAGNOSIS — Y929 Unspecified place or not applicable: Secondary | ICD-10-CM | POA: Insufficient documentation

## 2018-09-01 DIAGNOSIS — E119 Type 2 diabetes mellitus without complications: Secondary | ICD-10-CM | POA: Insufficient documentation

## 2018-09-01 DIAGNOSIS — Z23 Encounter for immunization: Secondary | ICD-10-CM | POA: Insufficient documentation

## 2018-09-01 DIAGNOSIS — W540XXA Bitten by dog, initial encounter: Secondary | ICD-10-CM | POA: Insufficient documentation

## 2018-09-01 DIAGNOSIS — T148XXA Other injury of unspecified body region, initial encounter: Secondary | ICD-10-CM

## 2018-09-01 DIAGNOSIS — Y999 Unspecified external cause status: Secondary | ICD-10-CM | POA: Insufficient documentation

## 2018-09-01 DIAGNOSIS — Y939 Activity, unspecified: Secondary | ICD-10-CM | POA: Insufficient documentation

## 2018-09-01 DIAGNOSIS — J45909 Unspecified asthma, uncomplicated: Secondary | ICD-10-CM | POA: Insufficient documentation

## 2018-09-01 DIAGNOSIS — F17210 Nicotine dependence, cigarettes, uncomplicated: Secondary | ICD-10-CM | POA: Insufficient documentation

## 2018-09-01 DIAGNOSIS — Z79899 Other long term (current) drug therapy: Secondary | ICD-10-CM | POA: Insufficient documentation

## 2018-09-01 MED ORDER — TETANUS-DIPHTH-ACELL PERTUSSIS 5-2.5-18.5 LF-MCG/0.5 IM SUSP
0.5000 mL | Freq: Once | INTRAMUSCULAR | Status: AC
  Administered 2018-09-01: 0.5 mL via INTRAMUSCULAR
  Filled 2018-09-01: qty 0.5

## 2018-09-01 NOTE — ED Notes (Signed)
Pt/family verbalized understanding of discharge instructions.   

## 2018-09-01 NOTE — Discharge Instructions (Signed)
Keep the wound  with soap and water. Make sure to pat dry the wound before covering it with any dressing. You can use topical antibiotic ointment and bandage. Ice and elevate for pain relief.   You can take Tylenol or Ibuprofen as directed for pain. You can alternate Tylenol and Ibuprofen every 4 hours for additional pain relief.   Monitor closely for any signs of infection. Return to the Emergency Department for any worsening redness/swelling of the area that begins to spread, drainage from the site, worsening pain, fever or any other worsening or concerning symptoms.

## 2018-09-01 NOTE — ED Provider Notes (Addendum)
[facility] EMERGENCY DEPARTMENT Provider Note   CSN: [id number] Arrival date & time: 09/01/18  1238     History   Chief Complaint Chief Complaint  Patient presents with  . Animal Bite    HPI Benjamin Shields is a 47 y.o. male with PMH/o Asthma, DM, HTN who presents for evaluation of abrasion to the LLE that occurred 4 days ago. Patient repotrs that his neighbors dog scratched him on the LLE. He states that it was mostly with the nails but there was one area where he scratched him with a tooth. He states that the area has been healing without any difficulty. He states that the dog is up to date on vaccines. He does not know when his last tetanus shot was. He states that the area has been painful. He has not taken any medication for pain. He states he came to the ED today "to teach that lady a lesson." He denies any fevers, warmth, redness, drainage from the site.   The history is provided by the patient.    Past Medical History:  Diagnosis Date  . Asthma   . Diabetes mellitus without complication (HCC)   . High cholesterol   . Hypertension   . Thyroid disease     There are no active problems to display for this patient.   History reviewed. No pertinent surgical history.      Home Medications    Prior to Admission medications   Medication Sig Start Date End Date Taking? Authorizing Provider  albuterol (PROVENTIL HFA;VENTOLIN HFA) 108 (90 Base) MCG/ACT inhaler Inhale 1-2 puffs into the lungs every 6 (six) hours as needed for wheezing or shortness of breath. 07/12/16   Cheri Fowler, PA-C  amoxicillin (AMOXIL) 500 MG capsule Take 2 capsules (1,000 mg total) by mouth 2 (two) times daily. 10/26/17   Pisciotta, Joni Reining, PA-C  Cetirizine HCl 10 MG CAPS Take 1 capsule (10 mg total) by mouth daily. 06/23/15   Palumbo, April, MD  fluticasone (FLOVENT HFA) 110 MCG/ACT inhaler Inhale 2 puffs into the lungs 2 (two) times daily. 06/23/15   Palumbo, April, MD    HYDROcodone-acetaminophen (NORCO/VICODIN) 5-325 MG tablet Take 1-2 tablets by mouth every 6 hours as needed for pain and/or cough. 10/26/17   Pisciotta, Joni Reining, PA-C  metroNIDAZOLE (FLAGYL) 500 MG tablet Take 1 tablet (500 mg total) by mouth 2 (two) times daily. One tab PO bid x 10 days 10/26/17   Pisciotta, Joni Reining, PA-C  naproxen (NAPROSYN) 500 MG tablet Take 1 tablet (500 mg total) by mouth 2 (two) times daily. 07/12/16   Cheri Fowler, PA-C    Family History No family history on file.  Social History Social History   Tobacco Use  . Smoking status: Current Some Day Smoker    Types: Cigarettes  . Smokeless tobacco: Never Used  Substance Use Topics  . Alcohol use: Yes    Comment: rarely  . Drug use: Yes    Types: Marijuana     Allergies   Banana and Penicillins   Review of Systems Review of Systems  Constitutional: Negative for fever.  Skin: Positive for wound.  All other systems reviewed and are negative.    Physical Exam Updated Vital Signs BP 112/87 (BP Location: Left Arm)   Pulse 94   Temp 98.5 F (36.9 C) (Oral)   Resp 18   Ht 5\' 9"  (1.753 m)   Wt 63.5 kg   SpO2 100%   BMI 20.67 kg/m   Physical Exam  Constitutional: He appears well-developed and well-nourished.  HENT:  Head: Normocephalic and atraumatic.  Eyes: Conjunctivae and EOM are normal. Right eye exhibits no discharge. Left eye exhibits no discharge. No scleral icterus.  Cardiovascular:  Pulses:      Dorsalis pedis pulses are 2+ on the right side, and 2+ on the left side.  Pulmonary/Chest: Effort normal.  Neurological: He is alert.  Skin: Skin is warm and dry.     Psychiatric: He has a normal mood and affect. His speech is normal and behavior is normal.  Nursing note and vitals reviewed.    ED Treatments / Results  Labs (all labs ordered are listed, but only abnormal results are displayed) Labs Reviewed - No data to display  EKG None  Radiology No results  found.  Procedures Procedures (including critical care time)  Medications Ordered in ED Medications  Tdap (BOOSTRIX) injection 0.5 mL (0.5 mLs Intramuscular Given 09/01/18 1347)     Initial Impression / Assessment and Plan / ED Course  I have reviewed the triage vital signs and the nursing notes.  Pertinent labs & imaging results that were available during my care of the patient were reviewed by me and considered in my medical decision making (see chart for details).     47 y.o. male who presents for evaluation of abrasions to the left lower leg that occurred 4 days ago after a dog scratched him. He states that most of the scratches were done with nails from his paw but states there was one area where he felt like the dogs tooth scratched his skin. He states that the dog did not ever bite him. He states that he has had some associated pain to the area but denies any surrounding warmth, erythema. No drainage from the sites. His tetanus is not up to date. Patient reports that the neighbor stated that the dog's vaccines were up to date. No fevers, warmth, erythema, purulent drainage.  On exam, patient has several scattered separate areas of small abrasions noted to medial aspect of the left lower extremity.  No evidence of bite wound.  No surrounding warmth, erythema.  No signs of surrounding infection or cellulitis.  Patient states that he felt like 1 of the scratches was caused by teeth.  There is no evidence of bite marks that would be indicative of need for antibiotics.   Encouraged at home supportive care measures. Patient had ample opportunity for questions and discussion. All patient's questions were answered with full understanding. Strict return precautions discussed. Patient expresses understanding and agreement to plan.   Final Clinical Impressions(s) / ED Diagnoses   Final diagnoses:  Abrasion    ED Discharge Orders    None       Maxwell Caul, PA-C 09/01/18 1950     Maxwell Caul, PA-C 09/01/18 2342    Little, Ambrose Finland, MD 09/03/18 [phone]

## 2018-09-01 NOTE — ED Triage Notes (Signed)
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

## 2018-12-07 DIAGNOSIS — F1420 Cocaine dependence, uncomplicated: Secondary | ICD-10-CM | POA: Insufficient documentation

## 2019-03-04 DIAGNOSIS — F1994 Other psychoactive substance use, unspecified with psychoactive substance-induced mood disorder: Secondary | ICD-10-CM | POA: Insufficient documentation

## 2019-06-15 IMAGING — DX DG CHEST 2V
2 series · 2 of 2 positions shown · non-contrast
Comparison: 06/10/2017 .

CLINICAL DATA: Cough and congestion.

EXAM:
CHEST  2 VIEW

[chest pa]
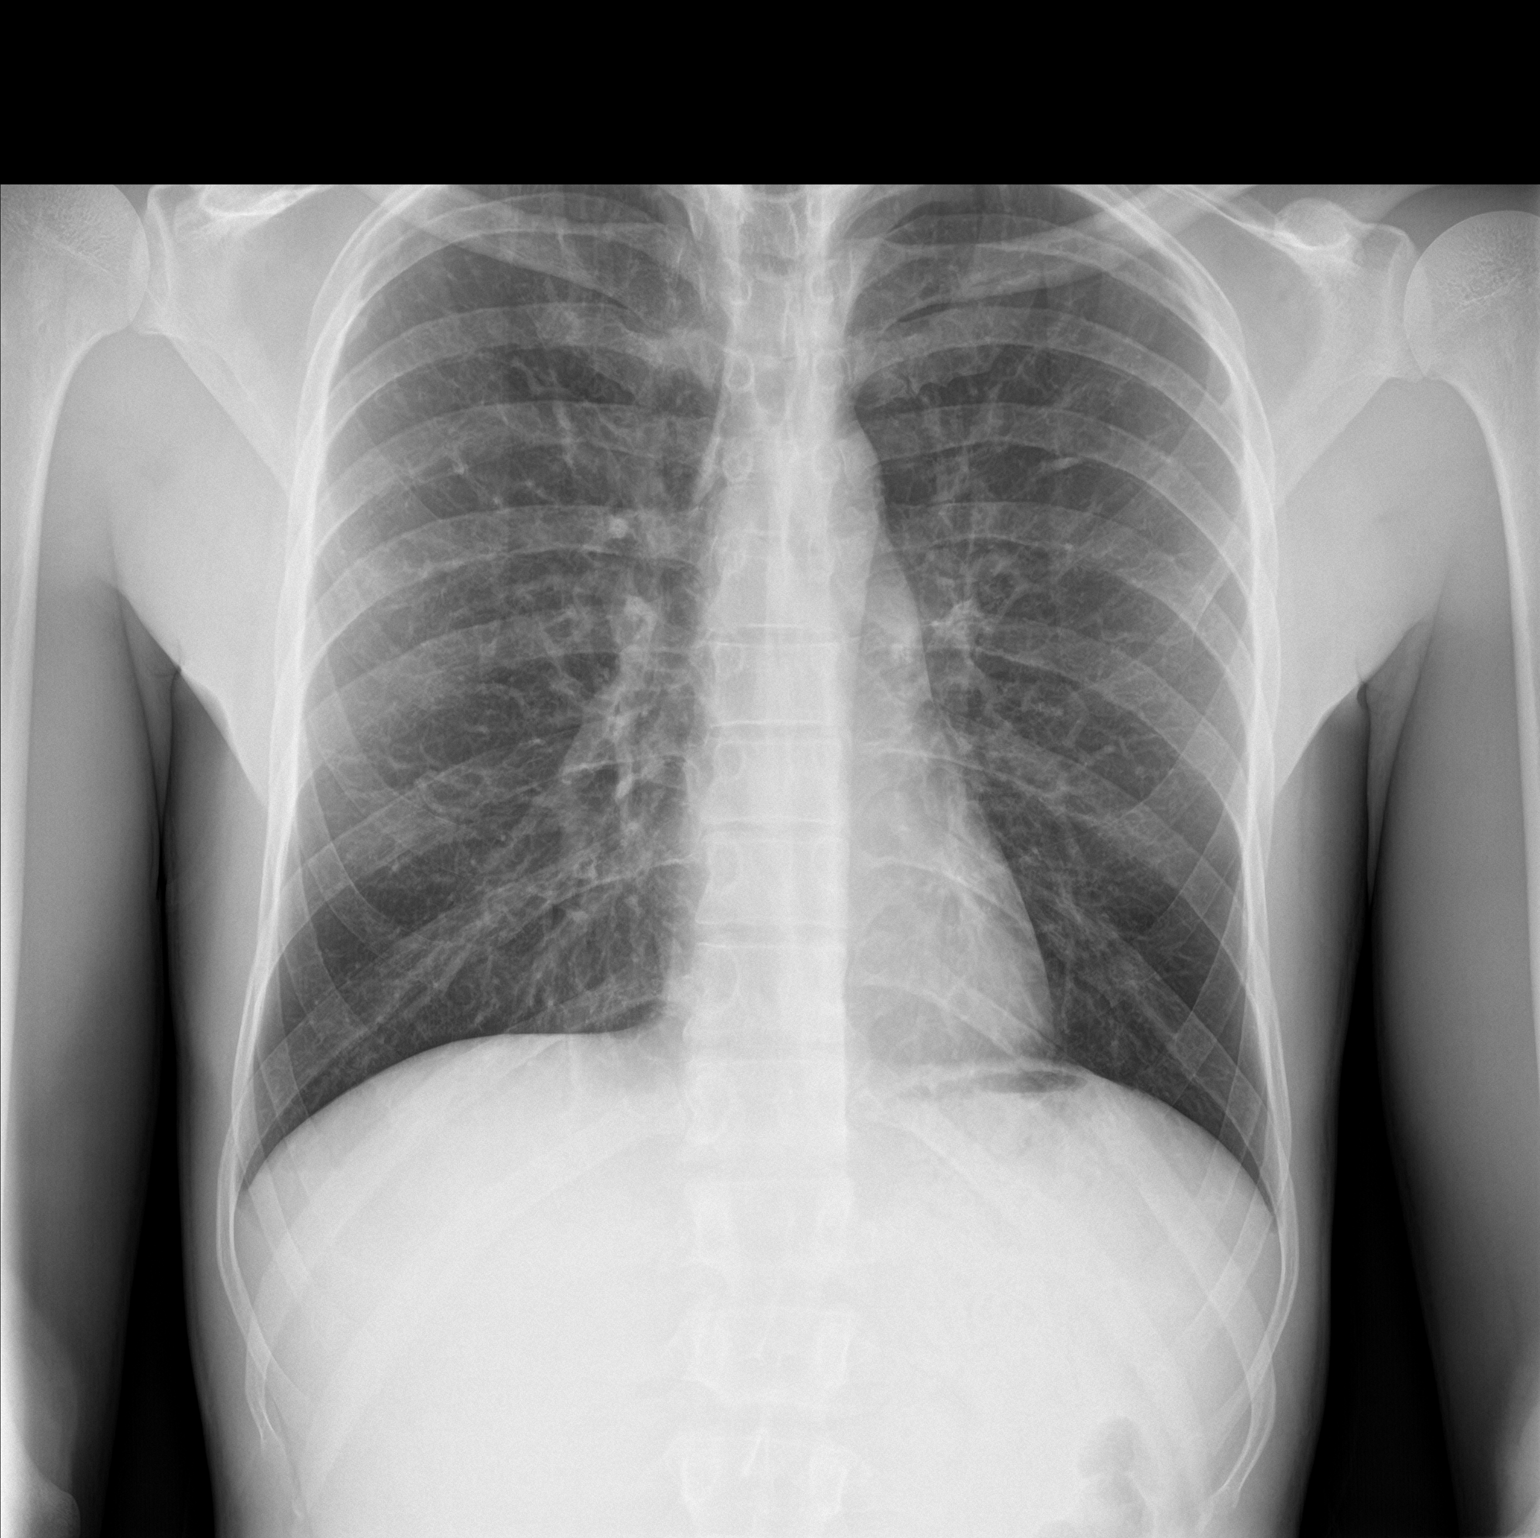

[chest lat]
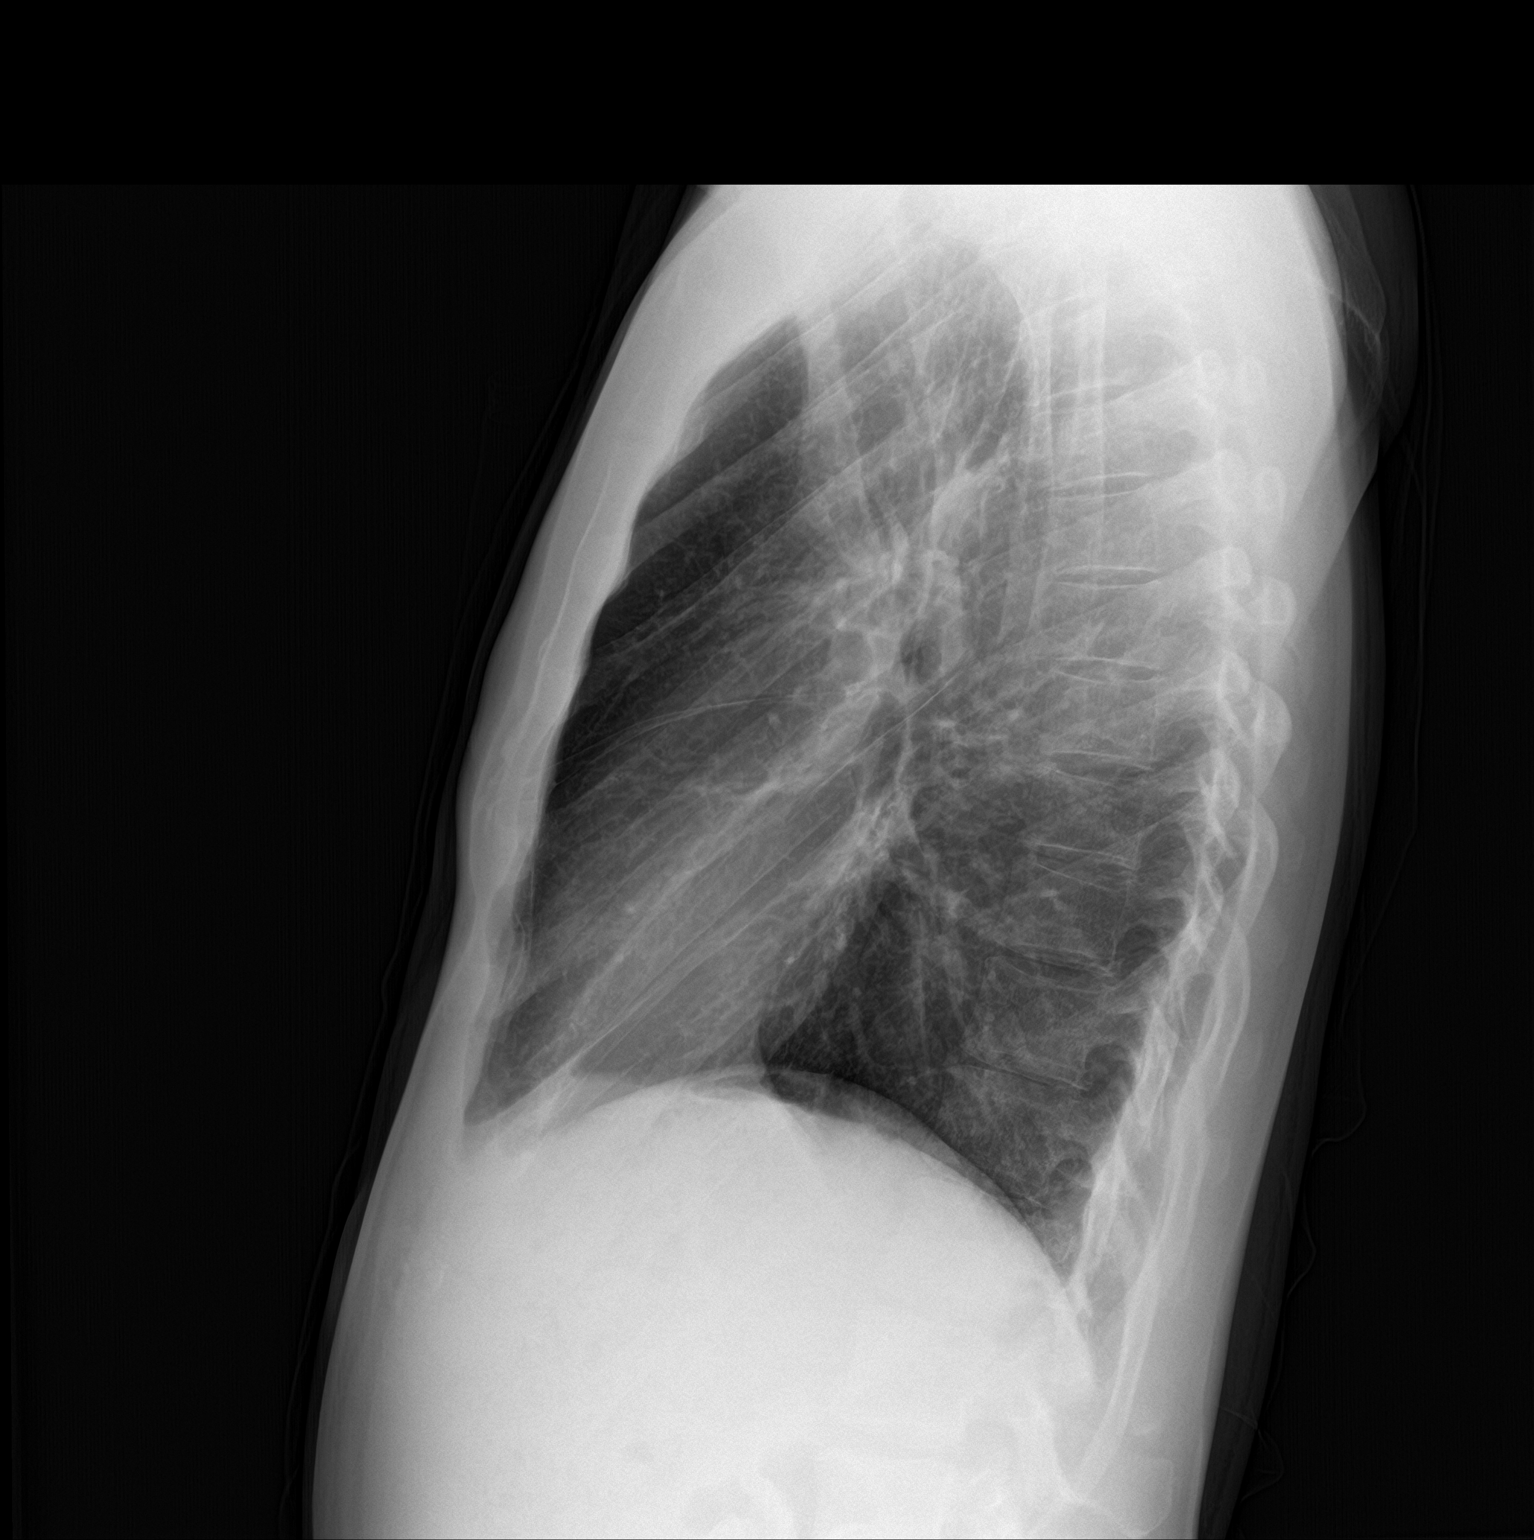

[2 of 2 positions shown; findings below may reference images not displayed]

FINDINGS: Mediastinum and hilar structures normal. Lungs are clear. No pleural
effusion or pneumothorax. Biapical pleural-parenchymal thickening
noted consistent scarring .
IMPRESSION: No acute abnormality.

## 2023-06-19 ENCOUNTER — Other Ambulatory Visit: Payer: Self-pay

## 2023-06-19 ENCOUNTER — Emergency Department (HOSPITAL_COMMUNITY)
Admission: EM | Admit: 2023-06-19 | Discharge: 2023-06-19 | Disposition: A | Discharge status: Home / Self Care | Payer: No Typology Code available for payment source | Source: Home / Self Care | Attending: Emergency Medicine | Admitting: Emergency Medicine

## 2023-06-19 ENCOUNTER — Emergency Department (HOSPITAL_COMMUNITY): Payer: No Typology Code available for payment source

## 2023-06-19 ENCOUNTER — Emergency Department (HOSPITAL_COMMUNITY)
Admission: EM | Admit: 2023-06-19 | Discharge: 2023-06-19 | Disposition: A | Discharge status: Home / Self Care | Payer: No Typology Code available for payment source | Attending: Emergency Medicine | Admitting: Emergency Medicine

## 2023-06-19 DIAGNOSIS — R9431 Abnormal electrocardiogram [ECG] [EKG]: Secondary | ICD-10-CM | POA: Diagnosis not present

## 2023-06-19 DIAGNOSIS — I10 Essential (primary) hypertension: Secondary | ICD-10-CM | POA: Insufficient documentation

## 2023-06-19 DIAGNOSIS — R5381 Other malaise: Secondary | ICD-10-CM

## 2023-06-19 DIAGNOSIS — R079 Chest pain, unspecified: Secondary | ICD-10-CM | POA: Insufficient documentation

## 2023-06-19 DIAGNOSIS — R002 Palpitations: Secondary | ICD-10-CM | POA: Insufficient documentation

## 2023-06-19 DIAGNOSIS — J45909 Unspecified asthma, uncomplicated: Secondary | ICD-10-CM | POA: Insufficient documentation

## 2023-06-19 DIAGNOSIS — M542 Cervicalgia: Secondary | ICD-10-CM

## 2023-06-19 DIAGNOSIS — R0789 Other chest pain: Secondary | ICD-10-CM | POA: Diagnosis not present

## 2023-06-19 DIAGNOSIS — F17210 Nicotine dependence, cigarettes, uncomplicated: Secondary | ICD-10-CM | POA: Insufficient documentation

## 2023-06-19 DIAGNOSIS — E119 Type 2 diabetes mellitus without complications: Secondary | ICD-10-CM | POA: Insufficient documentation

## 2023-06-19 DIAGNOSIS — F1490 Cocaine use, unspecified, uncomplicated: Secondary | ICD-10-CM

## 2023-06-19 LAB — COMPREHENSIVE METABOLIC PANEL
ALT: 13 U/L (ref 0–44)
AST: 21 U/L (ref 15–41)
Albumin: 3.2 g/dL — ABNORMAL LOW (ref 3.5–5.0)
Alkaline Phosphatase: 56 U/L (ref 38–126)
Anion gap: 9 (ref 5–15)
BUN: 21 mg/dL — ABNORMAL HIGH (ref 6–20)
CO2: 24 mmol/L (ref 22–32)
Calcium: 8.5 mg/dL — ABNORMAL LOW (ref 8.9–10.3)
Chloride: 105 mmol/L (ref 98–111)
Creatinine, Ser: 1.29 mg/dL — ABNORMAL HIGH (ref 0.61–1.24)
GFR, Estimated: >60 mL/min (ref >60)
Glucose, Bld: 109 mg/dL — ABNORMAL HIGH (ref 70–99)
Potassium: 3.6 mmol/L (ref 3.5–5.1)
Sodium: 138 mmol/L (ref 135–145)
Total Bilirubin: 0.5 mg/dL (ref 0.3–1.2)
Total Protein: 5.5 g/dL — ABNORMAL LOW (ref 6.5–8.1)

## 2023-06-19 LAB — CBC
HCT: 42.4 % (ref 39.0–52.0)
Hemoglobin: 14.1 g/dL (ref 13.0–17.0)
MCH: 29.8 pg (ref 26.0–34.0)
MCHC: 33.3 g/dL (ref 30.0–36.0)
MCV: 89.6 fL (ref 80.0–100.0)
Platelets: 255 10*3/uL (ref 150–400)
RBC: 4.73 MIL/uL (ref 4.22–5.81)
RDW: 13.9 % (ref 11.5–15.5)
WBC: 7.2 10*3/uL (ref 4.0–10.5)
nRBC: 0 % (ref 0.0–0.2)

## 2023-06-19 LAB — TROPONIN I (HIGH SENSITIVITY)
Troponin I (High Sensitivity): 3 ng/L (ref <18)
Troponin I (High Sensitivity): 4 ng/L (ref <18)
Troponin I (High Sensitivity): 5 ng/L (ref <18)

## 2023-06-19 NOTE — ED Notes (Signed)
Patient resting in bed with eyes closed, no s/s of distress, will continue to monitor.  

## 2023-06-19 NOTE — ED Provider Notes (Signed)
52 yo male presenting to ED with chest pain Reports cocaine use and marijuana use yesterday Pt arrived initially as code stemi last night, ECG felt to be likely BER pattern, case discussed with cardiology and EDP provider last night who agreed, and patient had negative troponins. While leaving the ED reported feeling dizzy with chest discomfort and checked back into ED  Pending 3rd troponin now and repeat ECG  I reviewed care everywhere, including [facility] cardiac workup from Sept 2023, as summarized below:  SUMMARY  The patient had chest pain in both stress and recovery  The patient had 09-07-09 chest pain.  The patient achieved 94 % of maximum predicted heart rate.  Negative dobutamine stress ECG for inducible ischemia at target heart  rate.   Normal left ventricular function at rest.  The estimated LV ejection fraction is 55-60% .  There were no segmental wall motion abnormalities at rest  There were no segmental wall motion abnormalities with dobutamine.  There was normal augmentation of all left ventricular wall segments  with dobutamine.  Negative dobutamine stress echocardiography for inducible ischemia at  target heart rate.   Physical Exam  BP 116/70   Pulse 72   Temp 97.8 F (36.6 C) (Oral)   Resp 16   SpO2 100%   Physical Exam  Procedures  Procedures  ED Course / MDM    Medical Decision Making Amount and/or Complexity of Data Reviewed ECG/medicine tests: ordered.   Patient was reassessed this morning, sleeping comfortably, I woke up and had a discussion with him.  I explained again that I think his chest pain is likely related to cocaine induced vasospasm.  However with his hypertension, diabetes, it may be reasonable to refer him to cardiology for this report of recurring chest pain.  He would prefer to see someone locally, not at [facility].  He does not have established care with cardiology at [facility].  I placed an ambulatory referral to  [facility].  His repeat EKG is stable, telemetry showing sinus rhythm, and his third troponin is negative.  With improved symptoms I think he is stable for discharge.  Low suspicion for acute PE, aortic dissection, or other life-threatening intrathoracic process.  I did review his workup from overnight.       Terald Sleeper, MD 06/19/23 [phone]

## 2023-06-19 NOTE — Discharge Instructions (Signed)
You were evaluated in the Emergency Department and after careful evaluation, we did not find any emergent condition requiring admission or further testing in the hospital.  Your exam/testing today was overall reassuring.  Can use Tylenol or Motrin for any lingering neck discomfort.  Your EKG was a bit abnormal but we suspect this is a normal variant or normal for you and not dangerous.  Please return to the Emergency Department if you experience any worsening of your condition.  Thank you for allowing [place] to be a part of your care.

## 2023-06-19 NOTE — ED Notes (Signed)
Bus pass provided per patient request, states he doesn't have any transportation.

## 2023-06-19 NOTE — Discharge Instructions (Addendum)
Please avoid using any cocaine in the future.  Cocaine is direct stimulant irritant for your heart.  It can cause bad chest pain and worsening heart conditions.

## 2023-06-19 NOTE — ED Triage Notes (Addendum)
Patient reports palpitations /chest tightness this morning after being discharged here an hour ago.

## 2023-06-19 NOTE — ED Provider Notes (Signed)
[facility]PT [facility] Emergency Department Provider Note MRN:  [id number]  Arrival date & time: 06/19/23     Chief Complaint   Palpitations   History of Present Illness   Benjamin Shields is a 52 y.o. year-old male with a history of hypertension, diabetes, asthma presenting to the ED with chief complaint of potation's.  Patient was discharged from the emergency department few hours ago.  He was about to leave the emergency department but then he felt a bit lightheaded with general malaise and he had some mild chest discomfort and so he decided to check back in.  Denies shortness of breath, no leg pain or swelling.  Review of Systems  A thorough review of systems was obtained and all systems are negative except as noted in the HPI and PMH.   Patient's Health History    Past Medical History:  Diagnosis Date   Asthma    Diabetes mellitus without complication (HCC)    High cholesterol    Hypertension    Thyroid disease     No past surgical history on file.  No family history on file.  Social History   Socioeconomic History   Marital status: Single    Spouse name: Not on file   Number of children: Not on file   Years of education: Not on file   Highest education level: Not on file  Occupational History   Not on file  Tobacco Use   Smoking status: Some Days    Types: Cigarettes   Smokeless tobacco: Never  Substance and Sexual Activity   Alcohol use: Yes    Comment: rarely   Drug use: Yes    Types: Marijuana   Sexual activity: Not on file  Other Topics Concern   Not on file  Social History Narrative   Not on file   Social Determinants of Health   Financial Resource Strain: Not on file  Food Insecurity: Not on file  Transportation Needs: Not on file  Physical Activity: Not on file  Stress: Not on file  Social Connections: Not on file  Intimate Partner Violence: Not on file     Physical Exam   Vitals:   06/19/23 0621  BP: 116/70  Pulse: 72   Resp: 16  Temp: 97.8 F (36.6 C)  SpO2: 100%    CONSTITUTIONAL: Well-appearing, NAD NEURO/PSYCH:  Alert and oriented x 3, no focal deficits EYES:  eyes equal and reactive ENT/NECK:  no LAD, no JVD CARDIO: Regular rate, well-perfused, normal S1 and S2 PULM:  CTAB no wheezing or rhonchi GI/GU:  non-distended, non-tender MSK/SPINE:  No gross deformities, no edema SKIN:  no rash, atraumatic   *Additional and/or pertinent findings included in MDM below  Diagnostic and Interventional Summary    EKG Interpretation  Date/Time:    Ventricular Rate:    PR Interval:    QRS Duration:   QT Interval:    QTC Calculation:   R Axis:     Text Interpretation:         Labs Reviewed  TROPONIN I (HIGH SENSITIVITY)    No orders to display    Medications - No data to display   Procedures  /  Critical Care Procedures  ED Course and Medical Decision Making  Initial Impression and Ddx I saw the patient a few hours ago.  He was doing marijuana and cocaine and then he blacked out or passed out.  He denied ever having any chest pain or shortness of breath.  He  was having some right lateral neck pain which had been present for a few days and worse with certain movement or palpation.  His EKG demonstrated diffuse ST segment elevation with a favorable morphology, seem more like benign early repolarization or pericarditis.  He was a code STEMI prior to arrival with EMS but this was canceled after discussing it with Dr. Darrold Junker of cardiology.  His workup a few hours ago was reassuring with normal chest x-ray, troponin negative x 2.  He was resting comfortably without symptoms.  Overall I still doubt emergent process.  He looks well and has normal vital signs again on my reassessment.  Will repeat EKG and troponin and placed on cardiac monitoring.  Anticipating discharge.  Past medical/surgical history that increases complexity of ED encounter: History of substance use disorder  Interpretation of  Diagnostics I personally reviewed the EKG and my interpretation is as follows: Sinus rhythm, benign early repolarization  Troponin pending  Patient Reassessment and Ultimate Disposition/Management     Signed out to oncoming rider at shift change.  Patient management required discussion with the following services or consulting groups:  None  Complexity of Problems Addressed Acute illness or injury that poses threat of life of bodily function  Additional Data Reviewed and Analyzed Further history obtained from: Prior labs/imaging results  Additional Factors Impacting ED Encounter Risk None  Elmer Sow. Pilar Plate, MD [facility] Emergency [facility] [email or web address]  Final Clinical Impressions(s) / ED Diagnoses     ICD-10-CM   1. Palpitations  R00.2     2. Malaise  R53.81     3. Chest pain, unspecified type  R07.9       ED Discharge Orders     None        Discharge Instructions Discussed with and Provided to Patient:   Discharge Instructions   None      Sabas Sous, MD 06/19/23 [phone]

## 2023-06-19 NOTE — ED Provider Notes (Signed)
[facility] Emergency Department Provider Note MRN:  [id number]  Arrival date & time: 06/19/23     Chief Complaint   Neck pain History of Present Illness   Benjamin Shields is a 52 y.o. year-old male with a history of hypertension, diabetes presenting to the ED with chief complaint of neck pain.  Patient explains that he was using marijuana and some cocaine and thinks he blacked out.  Denies any chest pain or shortness of breath at this time.  Does endorse some right-sided neck pain which has been present for a few days.  Code STEMI called prior to arrival.  Review of Systems  A thorough review of systems was obtained and all systems are negative except as noted in the HPI and PMH.   Patient's Health History    Past Medical History:  Diagnosis Date   Asthma    Diabetes mellitus without complication (HCC)    High cholesterol    Hypertension    Thyroid disease     No past surgical history on file.  No family history on file.  Social History   Socioeconomic History   Marital status: Single    Spouse name: Not on file   Number of children: Not on file   Years of education: Not on file   Highest education level: Not on file  Occupational History   Not on file  Tobacco Use   Smoking status: Some Days    Types: Cigarettes   Smokeless tobacco: Never  Substance and Sexual Activity   Alcohol use: Yes    Comment: rarely   Drug use: Yes    Types: Marijuana   Sexual activity: Not on file  Other Topics Concern   Not on file  Social History Narrative   Not on file   Social Determinants of Health   Financial Resource Strain: Not on file  Food Insecurity: Not on file  Transportation Needs: Not on file  Physical Activity: Not on file  Stress: Not on file  Social Connections: Not on file  Intimate Partner Violence: Not on file     Physical Exam   Vitals:   06/19/23 0330 06/19/23 0431  BP: 101/72   Pulse: 64   Resp: 15   Temp:  97.9 F (36.6  C)  SpO2: 100%     CONSTITUTIONAL: Well-appearing, NAD NEURO/PSYCH:  Alert and oriented x 3, no focal deficits EYES:  eyes equal and reactive ENT/NECK:  no LAD, no JVD CARDIO: Regular rate, well-perfused, normal S1 and S2 PULM:  CTAB no wheezing or rhonchi GI/GU:  non-distended, non-tender MSK/SPINE:  No gross deformities, no edema SKIN:  no rash, atraumatic   *Additional and/or pertinent findings included in MDM below  Diagnostic and Interventional Summary    EKG Interpretation  Date/Time:  Friday June 19 2023 00:41:59 EDT Ventricular Rate:  82 PR Interval:  158 QRS Duration: 78 QT Interval:  365 QTC Calculation: 427 R Axis:   82 Text Interpretation: Sinus rhythm Consider left atrial enlargement Left ventricular hypertrophy ST elevation suggests acute pericarditis Confirmed by Kennis Carina [phone]) on 06/19/2023 1:15:40 AM       Labs Reviewed  COMPREHENSIVE METABOLIC PANEL - Abnormal; Notable for the following components:      Result Value   Glucose, Bld 109 (*)    BUN 21 (*)    Creatinine, Ser 1.29 (*)    Calcium 8.5 (*)    Total Protein 5.5 (*)    Albumin 3.2 (*)  All other components within normal limits  CBC  TROPONIN I (HIGH SENSITIVITY)  TROPONIN I (HIGH SENSITIVITY)    DG Chest Port 1 View  Final Result      Medications - No data to display   Procedures  /  Critical Care Procedures  ED Course and Medical Decision Making  Initial Impression and Ddx EKG on arrival showing favorable morphology, seems more like benign early repolarization or pericarditis rather than acute MI.  Patient's presentation also does not really fit ACS.  Case discussed with Dr. Darrold Junker who agrees, code STEMI is canceled.  Past medical/surgical history that increases complexity of ED encounter: Substance use disorder  Interpretation of Diagnostics I personally reviewed the EKG and my interpretation is as follows: ST segment findings noted, favoring benign early  repolarization  Labs reassuring with no significant blood count or electrolyte disturbance.  Troponin negative x 2.  Patient Reassessment and Ultimate Disposition/Management     Discharge  Patient management required discussion with the following services or consulting groups:  Cardiology  Complexity of Problems Addressed Acute illness or injury that poses threat of life of bodily function  Additional Data Reviewed and Analyzed Further history obtained from: EMS on arrival  Additional Factors Impacting ED Encounter Risk None  Elmer Sow. Pilar Plate, MD [facility] Emergency Medicine [facility] [email or web address]  Final Clinical Impressions(s) / ED Diagnoses     ICD-10-CM   1. Neck pain  M54.2     2. Abnormal EKG  R94.31       ED Discharge Orders     None        Discharge Instructions Discussed with and Provided to Patient:     Discharge Instructions      You were evaluated in the Emergency Department and after careful evaluation, we did not find any emergent condition requiring admission or further testing in the hospital.  Your exam/testing today was overall reassuring.  Can use Tylenol or Motrin for any lingering neck discomfort.  Your EKG was a bit abnormal but we suspect this is a normal variant or normal for you and not dangerous.  Please return to the Emergency Department if you experience any worsening of your condition.  Thank you for allowing [place] to be a part of your care.        Sabas Sous, MD 06/19/23 [phone]

## 2023-06-20 ENCOUNTER — Encounter (HOSPITAL_COMMUNITY): Payer: Self-pay | Admitting: *Deleted

## 2023-06-20 ENCOUNTER — Emergency Department (HOSPITAL_COMMUNITY): Payer: No Typology Code available for payment source

## 2023-06-20 ENCOUNTER — Emergency Department (HOSPITAL_COMMUNITY)
Admission: EM | Admit: 2023-06-20 | Discharge: 2023-06-20 | Disposition: A | Discharge status: Home / Self Care | Payer: No Typology Code available for payment source | Attending: Emergency Medicine | Admitting: Emergency Medicine

## 2023-06-20 ENCOUNTER — Other Ambulatory Visit: Payer: Self-pay

## 2023-06-20 DIAGNOSIS — Z7951 Long term (current) use of inhaled steroids: Secondary | ICD-10-CM | POA: Insufficient documentation

## 2023-06-20 DIAGNOSIS — J45909 Unspecified asthma, uncomplicated: Secondary | ICD-10-CM | POA: Diagnosis not present

## 2023-06-20 DIAGNOSIS — R079 Chest pain, unspecified: Secondary | ICD-10-CM

## 2023-06-20 DIAGNOSIS — I10 Essential (primary) hypertension: Secondary | ICD-10-CM | POA: Insufficient documentation

## 2023-06-20 DIAGNOSIS — E118 Type 2 diabetes mellitus with unspecified complications: Secondary | ICD-10-CM | POA: Diagnosis not present

## 2023-06-20 LAB — BASIC METABOLIC PANEL
Anion gap: 12 (ref 5–15)
BUN: 11 mg/dL (ref 6–20)
CO2: 24 mmol/L (ref 22–32)
Calcium: 8.9 mg/dL (ref 8.9–10.3)
Chloride: 103 mmol/L (ref 98–111)
Creatinine, Ser: 0.92 mg/dL (ref 0.61–1.24)
GFR, Estimated: >60 mL/min (ref >60)
Glucose, Bld: 108 mg/dL — ABNORMAL HIGH (ref 70–99)
Potassium: 4.3 mmol/L (ref 3.5–5.1)
Sodium: 139 mmol/L (ref 135–145)

## 2023-06-20 LAB — CBC
HCT: 39.6 % (ref 39.0–52.0)
Hemoglobin: 13.6 g/dL (ref 13.0–17.0)
MCH: 30.6 pg (ref 26.0–34.0)
MCHC: 34.3 g/dL (ref 30.0–36.0)
MCV: 89.2 fL (ref 80.0–100.0)
Platelets: 271 10*3/uL (ref 150–400)
RBC: 4.44 MIL/uL (ref 4.22–5.81)
RDW: 13.8 % (ref 11.5–15.5)
WBC: 4.9 10*3/uL (ref 4.0–10.5)
nRBC: 0 % (ref 0.0–0.2)

## 2023-06-20 LAB — TROPONIN I (HIGH SENSITIVITY)
Troponin I (High Sensitivity): 3 ng/L (ref <18)
Troponin I (High Sensitivity): 3 ng/L (ref <18)

## 2023-06-20 LAB — D-DIMER, QUANTITATIVE: D-Dimer, Quant: 0.29 ug/mL-FEU (ref 0.00–0.50)

## 2023-06-20 MED ORDER — INDOMETHACIN 25 MG PO CAPS: 25.0000 mg | ORAL_CAPSULE | Freq: Three times a day (TID) | ORAL | 0 refills | Status: DC | PRN

## 2023-06-20 MED ORDER — SODIUM CHLORIDE 0.9 % IV BOLUS
500.0000 mL | Freq: Once | INTRAVENOUS | Status: AC
  Administered 2023-06-20: 500 mL via INTRAVENOUS

## 2023-06-20 NOTE — Discharge Instructions (Signed)
The test today in the ED were reassuring.  No signs of heart injury.  No signs of blood clot.  Your x-ray does not suggest an enlarged heart or pneumonia.  Pericarditis is a possibility as it can cause sharp type pain in your chest that will not show up on blood tests.  Take the anti-inflammatory medications as prescribed.  Follow-up with your primary care doctor the cardiologist as previously recommended to be rechecked.

## 2023-06-20 NOTE — ED Provider Notes (Signed)
[place] EMERGENCY DEPARTMENT AT [facility] Provider Note   CSN: [id number] Arrival date & time: 06/20/23  1616     History  Chief Complaint  Patient presents with   Pericardial Effusion    Benjamin Shields is a 52 y.o. male.  HPI   Patient has a history of asthma thyroid disease hypertension diabetes hypercholesterolemia.  He also has a history of negative dobutamine stress test in September 2023 at another medical facility.  He presents to the ED for evaluation of persistent chest pain.  Patient was evaluated in the emergency room yesterday with complaints of chest pain palpitations in the setting of recent cocaine and marijuana use.  Patient was initially activated as a code STEMI.  The EKG was reviewed with cardiology Dr. Darrold Junker and code stemi was cancelled.  Patient had serial troponins that were normal.  He was ultimately discharged Patient states he still has been feeling pain in his chest.  He still has been getting lightheaded at times.  He is concerned about his blood pressure being low.  Patient states a personal friend of this is a doctor and said the has pericarditis.  Patient states this was never mentioned by the doctors he saw yesterday.  Home Medications Prior to Admission medications   Medication Sig Start Date End Date Taking? Authorizing Provider  indomethacin (INDOCIN) 25 MG capsule Take 1 capsule (25 mg total) by mouth 3 (three) times daily as needed. 06/20/23  Yes Linwood Dibbles, MD  albuterol (PROVENTIL HFA;VENTOLIN HFA) 108 (90 Base) MCG/ACT inhaler Inhale 1-2 puffs into the lungs every 6 (six) hours as needed for wheezing or shortness of breath. 07/12/16   Cheri Fowler, PA-C  amoxicillin (AMOXIL) 500 MG capsule Take 2 capsules (1,000 mg total) by mouth 2 (two) times daily. 10/26/17   Pisciotta, Joni Reining, PA-C  Cetirizine HCl 10 MG CAPS Take 1 capsule (10 mg total) by mouth daily. 06/23/15   Palumbo, April, MD  fluticasone (FLOVENT HFA) 110 MCG/ACT inhaler  Inhale 2 puffs into the lungs 2 (two) times daily. 06/23/15   Palumbo, April, MD  HYDROcodone-acetaminophen (NORCO/VICODIN) 5-325 MG tablet Take 1-2 tablets by mouth every 6 hours as needed for pain and/or cough. 10/26/17   Pisciotta, Joni Reining, PA-C  metroNIDAZOLE (FLAGYL) 500 MG tablet Take 1 tablet (500 mg total) by mouth 2 (two) times daily. One tab PO bid x 10 days 10/26/17   Pisciotta, Joni Reining, PA-C  naproxen (NAPROSYN) 500 MG tablet Take 1 tablet (500 mg total) by mouth 2 (two) times daily. 07/12/16   Cheri Fowler, PA-C      Allergies    Banana and Penicillins    Review of Systems   Review of Systems  Physical Exam Updated Vital Signs BP 105/73   Pulse 63   Temp 98.4 F (36.9 C)   Resp 14   Ht 1.753 m (5\' 9" )   Wt 65.8 kg   SpO2 98%   BMI 21.42 kg/m  Physical Exam Vitals and nursing note reviewed.  Constitutional:      General: He is not in acute distress.    Appearance: He is well-developed.  HENT:     Head: Normocephalic and atraumatic.     Right Ear: External ear normal.     Left Ear: External ear normal.  Eyes:     General: No scleral icterus.       Right eye: No discharge.        Left eye: No discharge.     Conjunctiva/sclera: Conjunctivae  normal.  Neck:     Trachea: No tracheal deviation.  Cardiovascular:     Rate and Rhythm: Normal rate and regular rhythm.  Pulmonary:     Effort: Pulmonary effort is normal. No respiratory distress.     Breath sounds: Normal breath sounds. No stridor. No wheezing or rales.  Abdominal:     General: Bowel sounds are normal. There is no distension.     Palpations: Abdomen is soft.     Tenderness: There is no abdominal tenderness. There is no guarding or rebound.  Musculoskeletal:        General: No tenderness or deformity.     Cervical back: Neck supple.  Skin:    General: Skin is warm and dry.     Findings: No rash.  Neurological:     General: No focal deficit present.     Mental Status: He is alert.     Cranial Nerves:  No cranial nerve deficit, dysarthria or facial asymmetry.     Sensory: No sensory deficit.     Motor: No weakness, abnormal muscle tone or seizure activity.     Coordination: Coordination normal.     Comments: Normal strength and sensation bilateral upper extremities and lower extremities, normal finger-nose exam, no facial droop  Psychiatric:        Mood and Affect: Mood normal.     ED Results / Procedures / Treatments   Labs (all labs ordered are listed, but only abnormal results are displayed) Labs Reviewed  BASIC METABOLIC PANEL - Abnormal; Notable for the following components:      Result Value   Glucose, Bld 108 (*)    All other components within normal limits  CBC  D-DIMER, QUANTITATIVE  TROPONIN I (HIGH SENSITIVITY)  TROPONIN I (HIGH SENSITIVITY)    EKG None  Radiology DG Chest 2 View  Result Date: 06/20/2023 CLINICAL DATA:  Chest pain. EXAM: CHEST - 2 VIEW COMPARISON:  June 19, 2023. FINDINGS: The heart size and mediastinal contours are within normal limits. Both lungs are clear. The visualized skeletal structures are unremarkable. IMPRESSION: No active cardiopulmonary disease. Electronically Signed   By: Lupita Raider M.D.   On: 06/20/2023 17:13   DG Chest Port 1 View  Result Date: 06/19/2023 CLINICAL DATA:  Chest pain EXAM: PORTABLE CHEST 1 VIEW COMPARISON:  07/03/2017 FINDINGS: The heart size and mediastinal contours are within normal limits. Both lungs are clear. The visualized skeletal structures are unremarkable. IMPRESSION: No active disease. Electronically Signed   By: Alcide Clever M.D.   On: 06/19/2023 01:26    Procedures Procedures    Medications Ordered in ED Medications  sodium chloride 0.9 % bolus 500 mL (500 mLs Intravenous New Bag/Given 06/20/23 1919)    ED Course/ Medical Decision Making/ A&P Clinical Course as of 06/20/23 2012  Sat Jun 20, 2023  1838 Reviewed.  CBC troponin metabolic panel normal.  Chest x-ray negative for acute findings [JK]   1941 D-dimer negative [JK]    Clinical Course User Index [JK] Linwood Dibbles, MD                             Medical Decision Making Problems Addressed: Chest pain, unspecified type: acute illness or injury that poses a threat to life or bodily functions  Amount and/or Complexity of Data Reviewed Labs: ordered. Decision-making details documented in ED Course. Radiology: ordered and independent interpretation performed.  Risk Prescription drug management.   Patient  presented to the ED for evaluation of persistent chest pain.  Patient was evaluated yesterday in the emergency department.  His pain persist.  Patient was talking to a friend and was concerned that he might have pericarditis.  His EKG does not show definitive signs for pericarditis but it certainly a possibility.  He is not showing any signs of acute coronary syndrome.  D-dimer is negative no findings to suggest PE.  Patient is comfortable [place] doubt aortic dissection.  X-ray does not show any signs of cardiac enlargement or other acute abnormality.  Patient's workup is reassuring.  I think he is appropriate for close outpatient follow-up.  Discussed the findings with him we will try him on a course of anti-inflammatory medications.        Final Clinical Impression(s) / ED Diagnoses Final diagnoses:  Chest pain, unspecified type    Rx / DC Orders ED Discharge Orders          Ordered    indomethacin (INDOCIN) 25 MG capsule  3 times daily PRN        06/20/23 2011              Linwood Dibbles, MD 06/20/23 2013

## 2023-06-20 NOTE — ED Triage Notes (Signed)
The pt is c/o chest pain  dizziness and he was seen her yesterday and he reports that he was diagnosed with pericarditis and he feels like he was not treated as he should have been  he thinks that he should have been admitted   chest pain at present

## 2023-08-11 NOTE — Progress Notes (Deleted)
  Cardiology Office Note:  .    Date:  08/11/2023  ID:  Benjamin Shields, DOB 1971-02-05, MRN [id number] PCP: Patient, No Pcp Per  [facility] Providers Cardiologist:  None { Click to update primary MD,subspecialty MD or APP then REFRESH:1}    CC: *** Consulted for the evaluation of chest pain and palpitations at the behest of Dr. Renaye Rakers   History of Present Illness: .    Benjamin Shields is a 52 y.o. male with history of multiple ED visits 6 in 2024.    Relevant histories: .  Social *** ROS: As per HPI.   Studies Reviewed: .       *** Risk Assessment/Calculations:    {Does this patient have ATRIAL FIBRILLATION?[phone]}       Physical Exam:    VS:  There were no vitals taken for this visit.   Wt Readings from Last 3 Encounters:  06/20/23 145 lb 1 oz (65.8 kg)  06/19/23 145 lb (65.8 kg)  09/01/18 140 lb (63.5 kg)    Gen: *** distress, *** obese/well nourished/malnourished   Neck: No JVD, *** carotid bruit Ears: *** Frank Sign Cardiac: No Rubs or Gallops, *** Murmur, ***cardia, *** radial pulses Respiratory: Clear to auscultation bilaterally, *** effort, ***  respiratory rate GI: Soft, nontender, non-distended *** MS: No *** edema; *** moves all extremities Integument: Skin feels *** Neuro:  At time of evaluation, alert and oriented to person/place/time/situation *** Psych: Normal affect, patient feels ***   ASSESSMENT AND PLAN: .    ***   [name], MD FASE [facility] Cardiologist [facility]  [address] [place], [address] [place], [place] [address] [phone]  8:34 PM

## 2023-08-13 ENCOUNTER — Ambulatory Visit: Payer: No Typology Code available for payment source | Attending: Cardiovascular Disease | Admitting: Internal Medicine

## 2023-08-13 ENCOUNTER — Encounter: Payer: Self-pay | Admitting: Internal Medicine

## 2023-08-27 ENCOUNTER — Ambulatory Visit: Payer: No Typology Code available for payment source | Admitting: Cardiovascular Disease

## 2023-09-04 ENCOUNTER — Encounter (HOSPITAL_COMMUNITY): Payer: Self-pay

## 2023-09-04 ENCOUNTER — Emergency Department (HOSPITAL_COMMUNITY): Payer: Self-pay

## 2023-09-04 ENCOUNTER — Emergency Department (HOSPITAL_COMMUNITY)
Admission: EM | Admit: 2023-09-04 | Discharge: 2023-09-04 | Disposition: A | Discharge status: Home / Self Care | Payer: Self-pay | Attending: Emergency Medicine | Admitting: Emergency Medicine

## 2023-09-04 ENCOUNTER — Other Ambulatory Visit: Payer: Self-pay

## 2023-09-04 DIAGNOSIS — J45909 Unspecified asthma, uncomplicated: Secondary | ICD-10-CM | POA: Insufficient documentation

## 2023-09-04 DIAGNOSIS — Z79899 Other long term (current) drug therapy: Secondary | ICD-10-CM | POA: Insufficient documentation

## 2023-09-04 DIAGNOSIS — Z7951 Long term (current) use of inhaled steroids: Secondary | ICD-10-CM | POA: Insufficient documentation

## 2023-09-04 DIAGNOSIS — E119 Type 2 diabetes mellitus without complications: Secondary | ICD-10-CM | POA: Insufficient documentation

## 2023-09-04 DIAGNOSIS — U071 COVID-19: Secondary | ICD-10-CM | POA: Insufficient documentation

## 2023-09-04 DIAGNOSIS — M542 Cervicalgia: Secondary | ICD-10-CM | POA: Insufficient documentation

## 2023-09-04 DIAGNOSIS — I10 Essential (primary) hypertension: Secondary | ICD-10-CM | POA: Insufficient documentation

## 2023-09-04 DIAGNOSIS — R079 Chest pain, unspecified: Secondary | ICD-10-CM

## 2023-09-04 HISTORY — DX: Disease of pericardium, unspecified: I31.9

## 2023-09-04 LAB — COMPREHENSIVE METABOLIC PANEL
ALT: 15 U/L (ref 0–44)
AST: 20 U/L (ref 15–41)
Albumin: 3.3 g/dL — ABNORMAL LOW (ref 3.5–5.0)
Alkaline Phosphatase: 61 U/L (ref 38–126)
Anion gap: 9 (ref 5–15)
BUN: 9 mg/dL (ref 6–20)
CO2: 24 mmol/L (ref 22–32)
Calcium: 8.6 mg/dL — ABNORMAL LOW (ref 8.9–10.3)
Chloride: 105 mmol/L (ref 98–111)
Creatinine, Ser: 1.18 mg/dL (ref 0.61–1.24)
GFR, Estimated: >60 mL/min (ref >60)
Glucose, Bld: 101 mg/dL — ABNORMAL HIGH (ref 70–99)
Potassium: 3.6 mmol/L (ref 3.5–5.1)
Sodium: 138 mmol/L (ref 135–145)
Total Bilirubin: 0.6 mg/dL (ref 0.3–1.2)
Total Protein: 5.4 g/dL — ABNORMAL LOW (ref 6.5–8.1)

## 2023-09-04 LAB — CBC WITH DIFFERENTIAL/PLATELET
Abs Immature Granulocytes: 0.01 10*3/uL (ref 0.00–0.07)
Basophils Absolute: 0.1 10*3/uL (ref 0.0–0.1)
Basophils Relative: 1 %
Eosinophils Absolute: 0 10*3/uL (ref 0.0–0.5)
Eosinophils Relative: 1 %
HCT: 39.5 % (ref 39.0–52.0)
Hemoglobin: 13.5 g/dL (ref 13.0–17.0)
Immature Granulocytes: 0 %
Lymphocytes Relative: 33 %
Lymphs Abs: 1.3 10*3/uL (ref 0.7–4.0)
MCH: 31.3 pg (ref 26.0–34.0)
MCHC: 34.2 g/dL (ref 30.0–36.0)
MCV: 91.4 fL (ref 80.0–100.0)
Monocytes Absolute: 0.6 10*3/uL (ref 0.1–1.0)
Monocytes Relative: 15 %
Neutro Abs: 2.1 10*3/uL (ref 1.7–7.7)
Neutrophils Relative %: 50 %
Platelets: 227 10*3/uL (ref 150–400)
RBC: 4.32 MIL/uL (ref 4.22–5.81)
RDW: 13.8 % (ref 11.5–15.5)
WBC: 4.1 10*3/uL (ref 4.0–10.5)
nRBC: 0 % (ref 0.0–0.2)

## 2023-09-04 LAB — SARS CORONAVIRUS 2 BY RT PCR: SARS Coronavirus 2 by RT PCR: POSITIVE — AB

## 2023-09-04 LAB — TROPONIN I (HIGH SENSITIVITY)
Troponin I (High Sensitivity): 4 ng/L (ref <18)
Troponin I (High Sensitivity): 4 ng/L (ref <18)

## 2023-09-04 MED ORDER — KETOROLAC TROMETHAMINE 15 MG/ML IJ SOLN
15.0000 mg | Freq: Once | INTRAMUSCULAR | Status: AC
  Administered 2023-09-04: 15 mg via INTRAVENOUS
  Filled 2023-09-04: qty 1

## 2023-09-04 MED ORDER — OXYCODONE-ACETAMINOPHEN 5-325 MG PO TABS
2.0000 | ORAL_TABLET | Freq: Once | ORAL | Status: AC
  Administered 2023-09-04: 2 via ORAL
  Filled 2023-09-04: qty 2

## 2023-09-04 MED ORDER — OXYCODONE-ACETAMINOPHEN 5-325 MG PO TABS
1.0000 | ORAL_TABLET | Freq: Four times a day (QID) | ORAL | 0 refills | Status: DC | PRN
Start: 2023-09-04 — End: 2023-09-04

## 2023-09-04 MED ORDER — OXYCODONE-ACETAMINOPHEN 5-325 MG PO TABS: 1.0000 | ORAL_TABLET | Freq: Four times a day (QID) | ORAL | 0 refills | Status: DC | PRN

## 2023-09-04 MED ORDER — MOLNUPIRAVIR EUA 200MG CAPSULE: 4.0000 | ORAL_CAPSULE | Freq: Two times a day (BID) | ORAL | 0 refills | Status: AC

## 2023-09-04 NOTE — ED Triage Notes (Signed)
Pt c/o left sided chest pain that radiates to left side of neck and down to left rib area and HA started today. Pt c/o productive cough w/mucous started yesterday. Pt denies N/V, SOB

## 2023-09-04 NOTE — Discharge Instructions (Addendum)
As we discussed, this is likely related to COVID.  I think that you have a strained muscle in between your ribs from all of the coughing.  I have given you narcotic pain medication to your pharmacy.  Please take as prescribed.  Have also given you molnupiravir which is an antiviral for COVID given your history of asthma.  Please take as prescribed.  Please follow-up with your primary care doctor for further evaluation.  You may return to the emergency department for any worsening symptoms.

## 2023-09-04 NOTE — ED Provider Notes (Signed)
[place] EMERGENCY DEPARTMENT AT [facility] Provider Note   CSN: [id number] Arrival date & time: 09/04/23  5956     History Chief Complaint  Patient presents with   Chest Pain    Furkan Geren is a 52 y.o. male patient with history of hypertension, diabetes, and thyroid disease who presents to the emergency department today for further evaluation of chest pain and left-sided neck pain.  Patient states he has been having a productive cough since yesterday.  He is also today complaining of left-sided neck pain with associated headache.  Patient denies any history of headaches and he typically does not get them.  He denies any nausea, vomiting, diarrhea, shortness of breath, fever, chills.   Chest Pain      Home Medications Prior to Admission medications   Medication Sig Start Date End Date Taking? Authorizing Provider  molnupiravir EUA (LAGEVRIO) 200 mg CAPS capsule Take 4 capsules (800 mg total) by mouth 2 (two) times daily for 5 days. 09/04/23 09/09/23 Yes Kacee Koren M, PA-C  albuterol (PROVENTIL HFA;VENTOLIN HFA) 108 (90 Base) MCG/ACT inhaler Inhale 1-2 puffs into the lungs every 6 (six) hours as needed for wheezing or shortness of breath. 07/12/16   Cheri Fowler, PA-C  amoxicillin (AMOXIL) 500 MG capsule Take 2 capsules (1,000 mg total) by mouth 2 (two) times daily. 10/26/17   Pisciotta, Joni Reining, PA-C  Cetirizine HCl 10 MG CAPS Take 1 capsule (10 mg total) by mouth daily. 06/23/15   Palumbo, April, MD  fluticasone (FLOVENT HFA) 110 MCG/ACT inhaler Inhale 2 puffs into the lungs 2 (two) times daily. 06/23/15   Palumbo, April, MD  indomethacin (INDOCIN) 25 MG capsule Take 1 capsule (25 mg total) by mouth 3 (three) times daily as needed. 06/20/23   Linwood Dibbles, MD  metroNIDAZOLE (FLAGYL) 500 MG tablet Take 1 tablet (500 mg total) by mouth 2 (two) times daily. One tab PO bid x 10 days 10/26/17   Pisciotta, Joni Reining, PA-C  naproxen (NAPROSYN) 500 MG tablet Take 1 tablet (500 mg  total) by mouth 2 (two) times daily. 07/12/16   Cheri Fowler, PA-C  oxyCODONE-acetaminophen (PERCOCET/ROXICET) 5-325 MG tablet Take 1 tablet by mouth every 6 (six) hours as needed for severe pain. 09/04/23   Teressa Lower, PA-C      Allergies    Banana and Penicillins    Review of Systems   Review of Systems  Cardiovascular:  Positive for chest pain.  All other systems reviewed and are negative.   Physical Exam Updated Vital Signs BP 109/65   Pulse (!) 20   Temp 97.8 F (36.6 C) (Oral)   Resp 15   Ht 5\' 9"  (1.753 m)   Wt 65.8 kg   SpO2 99%   BMI 21.42 kg/m  Physical Exam Vitals and nursing note reviewed.  Constitutional:      General: He is not in acute distress.    Appearance: Normal appearance.  HENT:     Head: Normocephalic and atraumatic.  Eyes:     General:        Right eye: No discharge.        Left eye: No discharge.  Neck:     Comments: There is no midline tenderness.  There is palpable spasm over the left trapezius muscle. Cardiovascular:     Comments: Regular rate and rhythm.  S1/S2 are distinct without any evidence of murmur, rubs, or gallops.  Radial pulses are 2+ bilaterally.  Dorsalis pedis pulses are 2+ bilaterally.  No evidence of pedal edema. Pulmonary:     Comments: Clear to auscultation bilaterally.  Normal effort.  No respiratory distress.  No evidence of wheezes, rales, or rhonchi heard throughout. Abdominal:     General: Abdomen is flat. Bowel sounds are normal. There is no distension.     Tenderness: There is no abdominal tenderness. There is no guarding or rebound.  Musculoskeletal:        General: Normal range of motion.     Cervical back: Neck supple.  Skin:    General: Skin is warm and dry.     Findings: No rash.  Neurological:     General: No focal deficit present.     Mental Status: He is alert.  Psychiatric:        Mood and Affect: Mood normal.        Behavior: Behavior normal.     ED Results / Procedures / Treatments    Labs (all labs ordered are listed, but only abnormal results are displayed) Labs Reviewed  SARS CORONAVIRUS 2 BY RT PCR - Abnormal; Notable for the following components:      Result Value   SARS Coronavirus 2 by RT PCR POSITIVE (*)    All other components within normal limits  COMPREHENSIVE METABOLIC PANEL - Abnormal; Notable for the following components:   Glucose, Bld 101 (*)    Calcium 8.6 (*)    Total Protein 5.4 (*)    Albumin 3.3 (*)    All other components within normal limits  CBC WITH DIFFERENTIAL/PLATELET  TROPONIN I (HIGH SENSITIVITY)  TROPONIN I (HIGH SENSITIVITY)    EKG EKG Interpretation Date/Time:  Friday September 04 2023 07:43:30 EDT Ventricular Rate:  86 PR Interval:  164 QRS Duration:  84 QT Interval:  352 QTC Calculation: 421 R Axis:   80  Text Interpretation: Sinus rhythm Right atrial enlargement Probable left ventricular hypertrophy ST elev, probable normal early repol pattern No significant change since last tracing Confirmed by Alvira Monday ([id number]) on 09/04/2023 9:31:20 AM  Radiology CT Head Wo Contrast  Result Date: 09/04/2023 CLINICAL DATA:  Headache, sudden and severe. EXAM: CT HEAD WITHOUT CONTRAST TECHNIQUE: Contiguous axial images were obtained from the base of the skull through the vertex without intravenous contrast. RADIATION DOSE REDUCTION: This exam was performed according to the departmental dose-optimization program which includes automated exposure control, adjustment of the mA and/or kV according to patient size and/or use of iterative reconstruction technique. COMPARISON:  07/12/2016 FINDINGS: Brain: No evidence of acute infarction, hemorrhage, hydrocephalus, extra-axial collection or mass lesion/mass effect. Vascular: No hyperdense vessel or unexpected calcification. Skull: Normal. Negative for fracture or focal lesion. Sinuses/Orbits: Unremarkable IMPRESSION: Normal head CT. Electronically Signed   By: Tiburcio Pea M.D.   On: 09/04/2023  08:53   DG Chest 2 View  Result Date: 09/04/2023 CLINICAL DATA:  Chest pain EXAM: CHEST - 2 VIEW COMPARISON:  Chest x-ray dated June 20, 2023 FINDINGS: The heart size and mediastinal contours are within normal limits. Both lungs are clear. The visualized skeletal structures are unremarkable. IMPRESSION: No active cardiopulmonary disease. Electronically Signed   By: Allegra Lai M.D.   On: 09/04/2023 08:34    Procedures Procedures    Medications Ordered in ED Medications  oxyCODONE-acetaminophen (PERCOCET/ROXICET) 5-325 MG per tablet 2 tablet (has no administration in time range)  ketorolac (TORADOL) 15 MG/ML injection 15 mg (15 mg Intravenous Given 09/04/23 0913)    ED Course/ Medical Decision Making/ A&P Clinical Course as of 09/04/23 1236  Fri Sep 04, 2023  0931 SARS Coronavirus 2 by RT PCR (hospital order, performed in [facility] hospital lab) *cepheid single result test* Anterior Nasal Swab(!) Positive for COVID which is likely the source of his symptoms. [CF]  0931 CBC with Differential Normal. [CF]  0931 Mild hypocalcemia otherwise no abnormalities. [CF]  [id number] Troponin I (High Sensitivity) Initial troponin is normal.  [CF]  1150 Comprehensive metabolic panel(!) Mild hypocalcemia. Otherwise normal.  [CF]  1226 DG Chest 2 View I personally ordered and interpreted the study and do not see any evidence of pneumonia or fractured ribs.  I do agree with radiologist interpretation. [CF]  1227 CT Head Wo Contrast I personally ordered and interpreted the study and do not see any evidence of intracranial pathology to describe his headache.  This could be related to the positive COVID.  I do agree with the radiologist interpretation. [CF]  1228 On repeat evaluation, patient is still complaining of chest pain primarily when he takes a deep breath.  He states that after he had a large coughing spell this pain got worse.  This pain is reproducible with palpation of the left lower chest wall.   This could be a rib contusion versus costochondritis versus intercostal muscle strain from coughing. I have a low suspicion for PE at this time.  [CF]    Clinical Course User Index [CF] Teressa Lower, PA-C   {   Click here for ABCD2, HEART and other calculators  Medical Decision Making Emile Mention is a 52 y.o. male patient who presents to the emergency department today for further evaluation of chest pain and headache.  Given that the patient does not have a history of headaches I will plan to further evaluate with a CT scan of the head.  Also get EKG and troponin levels to evaluate this chest pain.  Chest x-ray to evaluate for possible pneumonia.  Overall, patient appears stable and apart from slightly high blood pressure vital signs are normal.  As highlighted in ED course, patient was still having some chest pain but this was reproducible with palpation of the chest wall.  I suspect this is likely a muscle strain from the amount of coughing.  Patient has a heart score of 3.  Will treat with narcotic pain medication molnupiravir given the patient's chronic history of asthma.  He is safe for discharge at this time.  Amount and/or Complexity of Data Reviewed Labs: ordered. Decision-making details documented in ED Course. Radiology: ordered. Decision-making details documented in ED Course.  Risk Prescription drug management.    Final Clinical Impression(s) / ED Diagnoses Final diagnoses:  COVID  Chest pain, unspecified type    Rx / DC Orders ED Discharge Orders          Ordered    oxyCODONE-acetaminophen (PERCOCET/ROXICET) 5-325 MG tablet  Every 6 hours PRN,   Status:  Discontinued        09/04/23 1232    molnupiravir EUA (LAGEVRIO) 200 mg CAPS capsule  2 times daily        09/04/23 1232    oxyCODONE-acetaminophen (PERCOCET/ROXICET) 5-325 MG tablet  Every 6 hours PRN        09/04/23 1233              Honor Loh [place], PA-C 09/04/23 1236    Alvira Monday,  MD 09/05/23 [phone]

## 2023-09-05 ENCOUNTER — Telehealth: Payer: Self-pay

## 2023-09-05 NOTE — Telephone Encounter (Signed)
Patient and other called to state the patients pain is worse even with medications and he could not get his antivital medicine as insurance would need to authorize it. Return precautions discussed. They will think about coming to get re-evaluated

## 2024-01-06 ENCOUNTER — Emergency Department (HOSPITAL_COMMUNITY): Payer: MEDICAID

## 2024-01-06 ENCOUNTER — Emergency Department (HOSPITAL_COMMUNITY)
Admission: EM | Admit: 2024-01-06 | Discharge: 2024-01-06 | Disposition: A | Discharge status: Home / Self Care | Payer: MEDICAID | Attending: Emergency Medicine | Admitting: Emergency Medicine

## 2024-01-06 ENCOUNTER — Other Ambulatory Visit: Payer: Self-pay

## 2024-01-06 DIAGNOSIS — S92514A Nondisplaced fracture of proximal phalanx of right lesser toe(s), initial encounter for closed fracture: Secondary | ICD-10-CM

## 2024-01-06 DIAGNOSIS — Y9301 Activity, walking, marching and hiking: Secondary | ICD-10-CM | POA: Insufficient documentation

## 2024-01-06 DIAGNOSIS — S60012A Contusion of left thumb without damage to nail, initial encounter: Secondary | ICD-10-CM | POA: Diagnosis not present

## 2024-01-06 DIAGNOSIS — S92515A Nondisplaced fracture of proximal phalanx of left lesser toe(s), initial encounter for closed fracture: Secondary | ICD-10-CM | POA: Insufficient documentation

## 2024-01-06 DIAGNOSIS — M79671 Pain in right foot: Secondary | ICD-10-CM | POA: Diagnosis present

## 2024-01-06 DIAGNOSIS — X58XXXA Exposure to other specified factors, initial encounter: Secondary | ICD-10-CM | POA: Diagnosis not present

## 2024-01-06 MED ORDER — ACETAMINOPHEN 500 MG PO TABS
1000.0000 mg | ORAL_TABLET | Freq: Once | ORAL | Status: AC
  Administered 2024-01-06: 1000 mg via ORAL
  Filled 2024-01-06: qty 2

## 2024-01-06 MED ORDER — CYCLOBENZAPRINE HCL 10 MG PO TABS: 10.0000 mg | ORAL_TABLET | Freq: Two times a day (BID) | ORAL | 0 refills | Status: DC | PRN

## 2024-01-06 NOTE — Discharge Instructions (Addendum)
 Follow-up with orthopedics or sports medicine for further evaluation.  Use Tylenol  every 4 hours and ice as needed for pain.   Wear your support shoe until you see the bone doctor. For severe pain you can take Tylenol /acetaminophen  along with NSAID every 6 hrs but not on an empty stomach. NSAIDS include ibuprofen/Motrin/indomethacin /naproxen Karolyn. For muscle spasm try flexeril  but no driving or operating machinery as it makes her sleepy. Wear your support shoe until you see specialist.

## 2024-01-06 NOTE — ED Notes (Signed)
 Patient transported to X-ray

## 2024-01-06 NOTE — Progress Notes (Signed)
 Orthopedic Tech Progress Note Patient Details:  Benjamin Shields 01-Dec-1971 [id number] Gave pt instructions on how to use crutches per order.  Ortho Devices Type of Ortho Device: Crutches Ortho Device/Splint Interventions: Ordered, Application, Adjustment   Post Interventions Patient Tolerated: Well Instructions Provided: Adjustment of device, Care of device  Morna Pink 01/06/2024, 12:39 PM

## 2024-01-06 NOTE — ED Provider Notes (Signed)
 [place] EMERGENCY DEPARTMENT AT [facility] Provider Note   CSN: [id number] Arrival date & time: 01/06/24  9156     History  Chief Complaint  Patient presents with   Toe Pain   thumb pain    Benjamin Shields is a 53 y.o. male.  Patient presents for evaluation of left thumb and right toe injury.  Few weeks ago patient injured his right distal foot fourth toe mechanically and has had pain with walking since.  Patient had an x-ray at an urgent care who was given a walking shoe and outpatient follow-up.  Patient injured his left thumb years ago has had mild deformity since then.  Patient recently strained it.  No fevers chills or vomiting.  The history is provided by the patient.  Toe Pain Pertinent negatives include no chest pain, no abdominal pain, no headaches and no shortness of breath.       Home Medications Prior to Admission medications   Medication Sig Start Date End Date Taking? Authorizing Provider  cyclobenzaprine  (FLEXERIL ) 10 MG tablet Take 1 tablet (10 mg total) by mouth 2 (two) times daily as needed for muscle spasms. 01/06/24  Yes Tonia Chew, MD  albuterol  (PROVENTIL  HFA;VENTOLIN  HFA) 108 (90 Base) MCG/ACT inhaler Inhale 1-2 puffs into the lungs every 6 (six) hours as needed for wheezing or shortness of breath. 07/12/16   Rose, Kayla, PA-C  amoxicillin  (AMOXIL ) 500 MG capsule Take 2 capsules (1,000 mg total) by mouth 2 (two) times daily. 10/26/17   Pisciotta, Nat, PA-C  Cetirizine  HCl 10 MG CAPS Take 1 capsule (10 mg total) by mouth daily. 06/23/15   Palumbo, April, MD  fluticasone  (FLOVENT  HFA) 110 MCG/ACT inhaler Inhale 2 puffs into the lungs 2 (two) times daily. 06/23/15   Palumbo, April, MD  indomethacin  (INDOCIN ) 25 MG capsule Take 1 capsule (25 mg total) by mouth 3 (three) times daily as needed. 06/20/23   Randol Simmonds, MD  metroNIDAZOLE  (FLAGYL ) 500 MG tablet Take 1 tablet (500 mg total) by mouth 2 (two) times daily. One tab PO bid x 10 days 10/26/17    Pisciotta, Nat, PA-C  naproxen  (NAPROSYN ) 500 MG tablet Take 1 tablet (500 mg total) by mouth 2 (two) times daily. 07/12/16   Rose, Kayla, PA-C  oxyCODONE -acetaminophen  (PERCOCET/ROXICET) 5-325 MG tablet Take 1 tablet by mouth every 6 (six) hours as needed for severe pain. 09/04/23   Theotis Cameron HERO, PA-C      Allergies    Banana and Penicillins    Review of Systems   Review of Systems  Constitutional:  Negative for chills and fever.  Respiratory:  Negative for shortness of breath.   Cardiovascular:  Negative for chest pain.  Gastrointestinal:  Negative for abdominal pain and vomiting.  Genitourinary:  Negative for flank pain.  Musculoskeletal:  Negative for back pain, neck pain and neck stiffness.  Skin:  Negative for rash.  Neurological:  Negative for light-headedness and headaches.    Physical Exam Updated Vital Signs BP (!) 120/107 (BP Location: Right Arm)   Pulse 73   Temp 97.6 F (36.4 C)   Resp 18   Ht 5' 9 (1.753 m)   Wt 67.1 kg   SpO2 99%   BMI 21.86 kg/m  Physical Exam Vitals and nursing note reviewed.  Constitutional:      General: He is not in acute distress.    Appearance: He is well-developed.  HENT:     Head: Normocephalic and atraumatic.     Mouth/Throat:  Mouth: Mucous membranes are moist.  Eyes:     General:        Right eye: No discharge.        Left eye: No discharge.     Conjunctiva/sclera: Conjunctivae normal.  Neck:     Trachea: No tracheal deviation.  Cardiovascular:     Rate and Rhythm: Normal rate.  Pulmonary:     Effort: Pulmonary effort is normal.  Abdominal:     General: There is no distension.     Palpations: Abdomen is soft.  Musculoskeletal:        General: Tenderness present. No swelling.     Cervical back: Normal range of motion and neck supple. No rigidity.     Comments: Patient has mild tenderness to palpation of dorsal proximal fourth toe without open wounds or deformity.  Patient can flex and extend with minimal  discomfort.  No other significant proximal foot tenderness.  Patient has no significant bony tenderness to palpation of left thumb.  Patient has mild laxity with abduction.  Patient can flex extend and has opposition.  No scaphoid tenderness.  Skin:    General: Skin is warm.     Capillary Refill: Capillary refill takes less than 2 seconds.     Findings: No rash.  Neurological:     General: No focal deficit present.     Mental Status: He is alert.  Psychiatric:        Mood and Affect: Mood normal.     ED Results / Procedures / Treatments   Labs (all labs ordered are listed, but only abnormal results are displayed) Labs Reviewed - No data to display  EKG None  Radiology DG Finger Thumb Left Result Date: 01/06/2024 CLINICAL DATA:  Fall, left thumb injury EXAM: LEFT THUMB 2+V COMPARISON:  None Available. FINDINGS: Osteoarthritis of the first MCP joint with associated spurring and loss of articular space. No fracture or dislocation identified. Mild ulnar subluxation of the proximal phalanx on the head of the first metacarpal, which may be incidental but could also be a secondary sign of radial collateral ligament tear. IMPRESSION: 1. No fracture or dislocation identified. 2. Osteoarthritis of the first MCP joint. 3. Mild ulnar subluxation of the proximal phalanx on the head of the first metacarpal, which may be incidental but could also be a secondary sign of radial collateral ligament tear. Electronically Signed   By: Ryan Salvage M.D.   On: 01/06/2024 11:49   DG Foot 2 Views Right Result Date: 01/06/2024 CLINICAL DATA:  Right fourth toe injury 2 weeks ago, continued pain. EXAM: RIGHT FOOT - 2 VIEW COMPARISON:  None Available. FINDINGS: Nondisplaced transverse fracture of the distal metaphysis of the proximal phalanx fourth toe. Small amount of adjacent osteoid tracking along the distal diaphysis margin. Otherwise unremarkable exam. IMPRESSION: 1. Nondisplaced transverse fracture of the  distal metaphysis of the proximal phalanx fourth toe. Electronically Signed   By: Ryan Salvage M.D.   On: 01/06/2024 11:45    Procedures Procedures    Medications Ordered in ED Medications  acetaminophen  (TYLENOL ) tablet 1,000 mg (has no administration in time range)    ED Course/ Medical Decision Making/ A&P                                 Medical Decision Making Amount and/or Complexity of Data Reviewed Radiology: ordered.  Risk OTC drugs. Prescription drug management.   Patient has toe fracture,  stable, patient has walking shoe and provided Ortho follow-up.  Tylenol  ordered for pain.  Patient has chronic thumb injury likely ligamentous related years ago.  Patient stable to follow-up with orthopedics for further evaluation and any further options of management.        Final Clinical Impression(s) / ED Diagnoses Final diagnoses:  Contusion of left thumb without damage to nail, initial encounter  Closed nondisplaced fracture of proximal phalanx of lesser toe of right foot, initial encounter    Rx / DC Orders ED Discharge Orders          Ordered    cyclobenzaprine  (FLEXERIL ) 10 MG tablet  2 times daily PRN        01/06/24 1131              Tonia Chew, MD 01/06/24 1159

## 2024-01-06 NOTE — ED Notes (Signed)
 ED Provider at bedside.

## 2024-01-06 NOTE — ED Notes (Signed)
 Patient resting comfortably on stretcher at time of discharge. NAD. Respirations regular, even, and unlabored. Color appropriate. Discharge/follow up instructions reviewed with parents at bedside with no further questions. Understanding verbalized by parents.

## 2024-01-06 NOTE — ED Triage Notes (Signed)
 Pt. Stated he wants to be reevaluated for a toe injury and a thumb injury when I was 27 .

## 2024-02-02 ENCOUNTER — Ambulatory Visit: Payer: MEDICAID | Admitting: Family Medicine

## 2024-02-02 ENCOUNTER — Encounter: Payer: Self-pay | Admitting: Family Medicine

## 2024-02-02 VITALS — BP 128/79 | HR 77 | Temp 97.6°F | Ht 69.75 in | Wt 148.4 lb

## 2024-02-02 DIAGNOSIS — F17210 Nicotine dependence, cigarettes, uncomplicated: Secondary | ICD-10-CM | POA: Diagnosis not present

## 2024-02-02 DIAGNOSIS — J101 Influenza due to other identified influenza virus with other respiratory manifestations: Secondary | ICD-10-CM

## 2024-02-02 DIAGNOSIS — J4521 Mild intermittent asthma with (acute) exacerbation: Secondary | ICD-10-CM

## 2024-02-02 DIAGNOSIS — F4310 Post-traumatic stress disorder, unspecified: Secondary | ICD-10-CM

## 2024-02-02 DIAGNOSIS — F909 Attention-deficit hyperactivity disorder, unspecified type: Secondary | ICD-10-CM

## 2024-02-02 DIAGNOSIS — F17200 Nicotine dependence, unspecified, uncomplicated: Secondary | ICD-10-CM

## 2024-02-02 LAB — POCT INFLUENZA A/B
Influenza A, POC: POSITIVE — AB
Influenza B, POC: NEGATIVE

## 2024-02-02 LAB — POC COVID19 BINAXNOW: SARS Coronavirus 2 Ag: NEGATIVE

## 2024-02-02 MED ORDER — OSELTAMIVIR PHOSPHATE 75 MG PO CAPS
75.0000 mg | ORAL_CAPSULE | Freq: Two times a day (BID) | ORAL | 0 refills | Status: DC
Start: 2024-02-02 — End: 2024-03-23

## 2024-02-02 MED ORDER — ALBUTEROL SULFATE HFA 108 (90 BASE) MCG/ACT IN AERS
2.0000 | INHALATION_SPRAY | Freq: Four times a day (QID) | RESPIRATORY_TRACT | 0 refills | Status: AC | PRN
Start: 2024-02-02 — End: ?

## 2024-02-02 MED ORDER — PREDNISONE 50 MG PO TABS
ORAL_TABLET | ORAL | 0 refills | Status: DC
Start: 2024-02-02 — End: 2024-03-23

## 2024-02-02 NOTE — Progress Notes (Signed)
 Assessment/Plan:      Influenza A with Asthma Exacerbation Presents with a two-week history of cough, rhinorrhea, fever, and myalgia, particularly in the legs, thighs, and back. Reports intermittent fevers, the most recent last night, and chest pain when coughing. Wheezing noted, consistent with asthma exacerbation triggered by Influenza A, confirmed by recent test results. Currently using albuterol  inhaler frequently. Discussed risks and benefits of prednisone  and Tamiflu , including potential gastrointestinal issues with concurrent steroid use and the importance of hydration and rest. Advised against ibuprofen due to potential gastrointestinal issues with concurrent steroid use. - Prescribe prednisone  50 mg for 5 days - Prescribe Tamiflu  for 5 days - Refill albuterol  inhaler - Advise use of acetaminophen  for fever and myalgia - Encourage adequate hydration and rest - Advise against ibuprofen - Recommend abstaining from sexual activity until recovery  ADHD and PTSD Reports worsening symptoms of ADHD and PTSD, including irritability, fidgetiness, and difficulty sitting still. Seeks to resume previously effective medications. Advised against starting new medications until recovery from current illness to avoid potential complications and interactions. Emphasized the importance of addressing chronic mental health issues in a follow-up visit. - Schedule follow-up appointment to discuss mental health medications post-recovery  General Health Maintenance Inquired about routine STD checks and general health maintenance. Advised to defer these tests until recovery from current illness. -Recommend smoking cessation to reduce severity and worsening of asthma lung other health risks including cancer heart disease - Schedule follow-up appointment for STD checks and general health maintenance post-recovery  Follow-up - Provide work clearance documentation post-recovery - Schedule follow-up appointment  to reassess health status and provide necessary documentation.        Medications Discontinued During This Encounter  Medication Reason   amoxicillin  (AMOXIL ) 500 MG capsule    Cetirizine  HCl 10 MG CAPS    indomethacin  (INDOCIN ) 25 MG capsule    metroNIDAZOLE  (FLAGYL ) 500 MG tablet    oxyCODONE -acetaminophen  (PERCOCET/ROXICET) 5-325 MG tablet    naproxen  (NAPROSYN ) 500 MG tablet    albuterol  (PROVENTIL  HFA;VENTOLIN  HFA) 108 (90 Base) MCG/ACT inhaler     Return if symptoms worsen or fail to improve.    Subjective:   Encounter date: 02/02/2024  Benjamin Shields is a 53 y.o. male who does not have a problem list on file.SABRA   He  has a past medical history of Asthma, Diabetes mellitus without complication (HCC), High cholesterol, Hypertension, Pericarditis, and Thyroid disease.SABRA   He presents with chief complaint of Establish Care (cough, fever, runny nose, body aches x 2 weeks. Patient would like cologuard.) .   HPI:  Discussed the use of AI scribe software for clinical note transcription with the patient, who gave verbal consent to proceed.  History of Present Illness   Benjamin Shields is a 53 year old male with asthma, ADHD, and PTSD who presents with cough, runny nose, fever, and body aches for two weeks.  He has been experiencing a cough, runny nose, fever, and body aches for the past two weeks. The fever is described as intense, likened to a 'ball of fire,' with an estimated temperature of around 101F, though he lacks a thermometer for confirmation. The fever persists, with the last episode occurring the previous night. Body aches are particularly noted in the legs, thighs, and back, described as 'flu-like pains' that worsen after physical exertion, such as after having sex, leading to headaches and increased body aches. No recent exposure to individuals with flu or COVID-19.  He experiences chest pain when coughing, described  as a 'lingering pain' around the heart area, but  denies any breathing difficulties or heart-related concerns. He has a history of asthma and uses an albuterol  inhaler frequently, approximately every four hours.  He expresses concerns about his mental health, mentioning worsening symptoms of ADHD and PTSD as he ages. He describes episodes of irritability, fidgetiness, and difficulty sitting still, which have been noticed by his partner.        No past surgical history on file.  Outpatient Medications Prior to Visit  Medication Sig Dispense Refill   cyclobenzaprine  (FLEXERIL ) 10 MG tablet Take 1 tablet (10 mg total) by mouth 2 (two) times daily as needed for muscle spasms. 10 tablet 0   fluticasone  (FLOVENT  HFA) 110 MCG/ACT inhaler Inhale 2 puffs into the lungs 2 (two) times daily. 1 Inhaler 12   albuterol  (PROVENTIL  HFA;VENTOLIN  HFA) 108 (90 Base) MCG/ACT inhaler Inhale 1-2 puffs into the lungs every 6 (six) hours as needed for wheezing or shortness of breath. 1 Inhaler 0   amoxicillin  (AMOXIL ) 500 MG capsule Take 2 capsules (1,000 mg total) by mouth 2 (two) times daily. (Patient not taking: Reported on 02/02/2024) 40 capsule 0   Cetirizine  HCl 10 MG CAPS Take 1 capsule (10 mg total) by mouth daily. (Patient not taking: Reported on 02/02/2024) 30 capsule 0   indomethacin  (INDOCIN ) 25 MG capsule Take 1 capsule (25 mg total) by mouth 3 (three) times daily as needed. (Patient not taking: Reported on 02/02/2024) 30 capsule 0   metroNIDAZOLE  (FLAGYL ) 500 MG tablet Take 1 tablet (500 mg total) by mouth 2 (two) times daily. One tab PO bid x 10 days (Patient not taking: Reported on 02/02/2024) 20 tablet 0   naproxen  (NAPROSYN ) 500 MG tablet Take 1 tablet (500 mg total) by mouth 2 (two) times daily. (Patient not taking: Reported on 02/02/2024) 30 tablet 0   oxyCODONE -acetaminophen  (PERCOCET/ROXICET) 5-325 MG tablet Take 1 tablet by mouth every 6 (six) hours as needed for severe pain. (Patient not taking: Reported on 02/02/2024) 15 tablet 0   No  facility-administered medications prior to visit.    History reviewed. No pertinent family history.  Social History   Socioeconomic History   Marital status: Single    Spouse name: Not on file   Number of children: Not on file   Years of education: Not on file   Highest education level: Not on file  Occupational History   Not on file  Tobacco Use   Smoking status: Some Days    Types: Cigarettes   Smokeless tobacco: Never  Substance and Sexual Activity   Alcohol use: Yes    Comment: rarely   Drug use: Yes    Types: Marijuana   Sexual activity: Not on file  Other Topics Concern   Not on file  Social History Narrative   Not on file   Social Drivers of Health   Financial Resource Strain: Not on file  Food Insecurity: Not on file  Transportation Needs: Not on file  Physical Activity: Not on file  Stress: Not on file  Social Connections: Not on file  Intimate Partner Violence: Not on file  Objective:  Physical Exam: BP 128/79   Pulse 77   Temp 97.6 F (36.4 C) (Temporal)   Ht 5' 9.75 (1.772 m)   Wt 148 lb 6.4 oz (67.3 kg)   SpO2 99%   BMI 21.45 kg/m     Physical Exam  DG Finger Thumb Left Result Date: 01/06/2024 CLINICAL DATA:  Fall, left thumb injury EXAM: LEFT THUMB 2+V COMPARISON:  None Available. FINDINGS: Osteoarthritis of the first MCP joint with associated spurring and loss of articular space. No fracture or dislocation identified. Mild ulnar subluxation of the proximal phalanx on the head of the first metacarpal, which may be incidental but could also be a secondary sign of radial collateral ligament tear. IMPRESSION: 1. No fracture or dislocation identified. 2. Osteoarthritis of the first MCP joint. 3. Mild ulnar subluxation of the proximal phalanx on the head of the first metacarpal, which may be incidental but could also be a secondary sign of radial  collateral ligament tear. Electronically Signed   By: Ryan Salvage M.D.   On: 01/06/2024 11:49   DG Foot 2 Views Right Result Date: 01/06/2024 CLINICAL DATA:  Right fourth toe injury 2 weeks ago, continued pain. EXAM: RIGHT FOOT - 2 VIEW COMPARISON:  None Available. FINDINGS: Nondisplaced transverse fracture of the distal metaphysis of the proximal phalanx fourth toe. Small amount of adjacent osteoid tracking along the distal diaphysis margin. Otherwise unremarkable exam. IMPRESSION: 1. Nondisplaced transverse fracture of the distal metaphysis of the proximal phalanx fourth toe. Electronically Signed   By: Ryan Salvage M.D.   On: 01/06/2024 11:45    Recent Results (from the past 2160 hours)  POC COVID-19 [employer]     Status: None   Collection Time: 02/02/24  2:31 PM  Result Value Ref Range   SARS Coronavirus 2 Ag Negative Negative  POCT Influenza A/B     Status: Abnormal   Collection Time: 02/02/24  2:31 PM  Result Value Ref Range   Influenza A, POC Positive (A) Negative   Influenza B, POC Negative Negative        Beverley Adine Hummer, MD, MS

## 2024-02-02 NOTE — Patient Instructions (Signed)
 VISIT SUMMARY:  Benjamin Shields, a 53 year old male with asthma, ADHD, and PTSD, visited today due to a two-week history of cough, runny nose, fever, and body aches. He also reported worsening symptoms of ADHD and PTSD. After a thorough examination, it was determined that he has Influenza A with an asthma exacerbation.  YOUR PLAN:  -INFLUENZA A WITH ASTHMA EXACERBATION: You have been diagnosed with Influenza A, which is a viral infection that can cause fever, cough, and body aches. This has also triggered an asthma exacerbation, making your asthma symptoms worse. You have been prescribed prednisone  50 mg for 5 days and Tamiflu  for 5 days. Continue using your albuterol  inhaler as needed and use acetaminophen  for fever and body aches. Make sure to stay hydrated, get plenty of rest, and avoid ibuprofen. Please refrain from sexual activity until you have fully recovered.  -ADHD AND PTSD: Your symptoms of ADHD and PTSD, such as irritability and difficulty sitting still, have worsened. ADHD is a condition that affects attention and behavior, while PTSD is a mental health condition triggered by a traumatic event. We will not start any new medications for these conditions until you have recovered from your current illness to avoid complications. A follow-up appointment will be scheduled to discuss your mental health medications once you are better.  -GENERAL HEALTH MAINTENANCE: We discussed routine health checks, including STD tests. These will be deferred until you have recovered from your current illness. A follow-up appointment will be scheduled for these tests and general health maintenance.  INSTRUCTIONS:  Please schedule a follow-up appointment to reassess your health status and discuss mental health medications post-recovery. Additionally, we will provide work clearance documentation once you have recovered.

## 2024-02-29 ENCOUNTER — Ambulatory Visit: Payer: Self-pay | Admitting: Family Medicine

## 2024-02-29 NOTE — Telephone Encounter (Signed)
 Noted patient scheduled With Family Medicine Gerre Scull, NP) 03/02/2024 at 9:00 AM

## 2024-02-29 NOTE — Telephone Encounter (Signed)
  Chief Complaint: back pain Symptoms: mid back pain Frequency: ongoing, worse recently Pertinent Negatives: Patient denies injury, GU s/s, SOB, CMS changes Disposition: [] ED /[] Urgent Care (no appt availability in office) / [x] Appointment(In office/virtual)/ []  [place] [facility]/ [] Home Care/ [] Refused Recommended Disposition /[] [facility]/ []  Follow-up with PCP  Additional Notes: Pt states that he has had this pain before and was told that it was inflammation. Pt states that he isn't sure if its his kidneys or what. Pt denies GU s/s, denies fever, denies injury. 2 episodes of vomiting last night, diarrhea for a few weeks. Pt requesting to end phone call d/t needing to call his mom, advised that last step is just needing to make appt. Appt made, with prov in pcp office. Offered appts tomorrow but d/t transportation issues requested 3/5 appt, states need to give 48 hour notice to medical transport .  Copied from CRM [id number]. Topic: Clinical - Red Word Triage >> Feb 29, 2024  1:54 PM Alvino Blood C wrote: Red Word that prompted transfer to Nurse Triage: Patient has been having sever back pain for the last 3 days. He took 8 tylenol pills for the pain but nothing is helping. Caller believes it may have to do with patients kidneys Reason for Disposition  [1] MODERATE back pain (e.g., interferes with normal activities) AND [2] present > 3 days  Answer Assessment - Initial Assessment Questions 1. ONSET: "When did the pain begin?"      Mid back pain 2. LOCATION: "Where does it hurt?" (upper, mid or lower back)     Mid back 3. SEVERITY: "How bad is the pain?"  (e.g., Scale 1-10; mild, moderate, or severe)   - MILD (1-3): Doesn't interfere with normal activities.    - MODERATE (4-7): Interferes with normal activities or awakens from sleep.    - SEVERE (8-10): Excruciating pain, unable to do any normal activities.      7 4. PATTERN: "Is the pain constant?" (e.g., yes, no; constant,  intermittent)      intermittent 5. RADIATION: "Does the pain shoot into your legs or somewhere else?"     denies 6. CAUSE:  "What do you think is causing the back pain?"      Unsure, has had hx of kidney problems 7. BACK OVERUSE:  "Any recent lifting of heavy objects, strenuous work or exercise?"     denies 8. MEDICINES: "What have you taken so far for the pain?" (e.g., nothing, acetaminophen, NSAIDS)     Tylenol helps very little 9. NEUROLOGIC SYMPTOMS: "Do you have any weakness, numbness, or problems with bowel/bladder control?"     denies 10. OTHER SYMPTOMS: "Do you have any other symptoms?" (e.g., fever, abdomen pain, burning with urination, blood in urine)       Diarrhea, denies GU  Protocols used: Back Pain-A-AH

## 2024-03-02 ENCOUNTER — Ambulatory Visit: Payer: MEDICAID | Admitting: Nurse Practitioner

## 2024-03-10 ENCOUNTER — Telehealth: Payer: Self-pay | Admitting: Family Medicine

## 2024-03-10 NOTE — Telephone Encounter (Signed)
 03/02/2024 same day cancel/medical transportation did not come per notes, letter sent via [facility]

## 2024-03-23 ENCOUNTER — Ambulatory Visit (HOSPITAL_COMMUNITY)
Admission: EM | Admit: 2024-03-23 | Discharge: 2024-03-23 | Disposition: A | Discharge status: Home / Self Care | Payer: MEDICAID | Attending: Nurse Practitioner | Admitting: Nurse Practitioner

## 2024-03-23 DIAGNOSIS — F22 Delusional disorders: Secondary | ICD-10-CM | POA: Insufficient documentation

## 2024-03-23 DIAGNOSIS — Z9151 Personal history of suicidal behavior: Secondary | ICD-10-CM | POA: Insufficient documentation

## 2024-03-23 DIAGNOSIS — F4310 Post-traumatic stress disorder, unspecified: Secondary | ICD-10-CM | POA: Insufficient documentation

## 2024-03-23 MED ORDER — OLANZAPINE 5 MG PO TABS: 5.0000 mg | ORAL_TABLET | Freq: Every day | ORAL | 0 refills | Status: DC

## 2024-03-23 MED ORDER — HYDROXYZINE HCL 10 MG PO TABS: 25.0000 mg | ORAL_TABLET | Freq: Three times a day (TID) | ORAL | 0 refills | Status: DC

## 2024-03-23 NOTE — ED Notes (Signed)
 Pt leaving now, AVS given. 3 day supply of meds discussed. Upcoming appt discussed. No further concerns. Belongings gotten from locker.

## 2024-03-23 NOTE — Discharge Instructions (Signed)
 Instructions are above, please ensure that you attend your outpatient appointment on Friday, 3/28, as we will not be providing any more medications to you at this location.
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
## 2024-03-25 ENCOUNTER — Ambulatory Visit (INDEPENDENT_AMBULATORY_CARE_PROVIDER_SITE_OTHER): Payer: MEDICAID | Admitting: Family Medicine

## 2024-03-25 ENCOUNTER — Encounter: Payer: Self-pay | Admitting: Family Medicine

## 2024-03-25 VITALS — BP 128/78 | HR 74 | Temp 97.0°F | Ht 69.5 in | Wt 157.4 lb

## 2024-03-25 DIAGNOSIS — Z1211 Encounter for screening for malignant neoplasm of colon: Secondary | ICD-10-CM

## 2024-03-25 DIAGNOSIS — K4090 Unilateral inguinal hernia, without obstruction or gangrene, not specified as recurrent: Secondary | ICD-10-CM | POA: Diagnosis not present

## 2024-03-25 DIAGNOSIS — M25561 Pain in right knee: Secondary | ICD-10-CM

## 2024-03-25 DIAGNOSIS — F22 Delusional disorders: Secondary | ICD-10-CM

## 2024-03-25 DIAGNOSIS — G8929 Other chronic pain: Secondary | ICD-10-CM

## 2024-03-25 MED ORDER — MELOXICAM 15 MG PO TABS: 15.0000 mg | ORAL_TABLET | Freq: Every day | ORAL | 0 refills | Status: DC

## 2024-03-25 NOTE — Patient Instructions (Signed)
 VISIT SUMMARY:  Benjamin Shields, a 53 year old male, came in for his annual physical exam. He reported right knee pain and instability, ongoing pain at the site of a previous varicocele surgery, and mood-related issues including paranoia. We discussed treatment options and necessary follow-up steps for each of these concerns.  YOUR PLAN:  -RIGHT KNEE PAIN: You have been experiencing right knee pain and instability, which may be due to a meniscal issue. We will start you on meloxicam 15 mg once daily to help with pain and inflammation. An x-ray of your right knee has been ordered, and you will be referred to physical therapy for further evaluation and management. Additionally, you should consider using a knee brace and perform home rehabilitation strengthening exercises.  -ABDOMINAL PAIN: You have chronic pain at the site of your previous varicocele surgery. This could be due to an incisional or inguinal hernia. A CT scan of your abdominal area with contrast has been ordered to evaluate for a possible hernia.  -PARANOIA: You are experiencing paranoia and are currently on olanzapine and hydroxyzine. We discussed the possibility of trying other medications like Adderall and Zoloft. You will be referred to psychiatry for further evaluation and management of your condition and medication adjustment.  -GENERAL HEALTH MAINTENANCE: At 53 years old, you are due for routine health screenings. We will refer you for a colonoscopy for cancer screening and order basic lab work to check your cholesterol, kidney function, blood sugar, and urine analysis.  INSTRUCTIONS:  Please schedule a follow-up appointment in one month to evaluate your knee pain and complete your physical examination with fasting lab work. Ensure you receive referrals for gastroenterology and psychiatry. If you experience severe pain or vomiting, please go to the emergency department as it may indicate hernia complications.

## 2024-03-25 NOTE — Progress Notes (Signed)
 Assessment  Assessment/Plan:   Assessment & Plan Right Knee Pain Right knee pain for the past couple of weeks with episodes of locking and giving out. Pain is located around the kneecap and on the sides. Previous x-rays in February 2023 showed no abnormalities. Current symptoms suggest a possible meniscal issue. Tylenol and ibuprofen have been ineffective in managing the pain. Meloxicam is proposed to reduce pain and inflammation, aiding in performing strengthening exercises. X-ray and physical therapy are considered for further evaluation and management. - Prescribe meloxicam 15 mg once daily as needed for pain and inflammation. - Order x-ray of the right knee. - Provide home rehabilitation strengthening exercises. - Refer to physical therapy for further evaluation and management. - Advise considering a knee brace.  Abdominal Pain Chronic abdominal pain at the site of a previous varicocele surgery incision near the groin. Pain is sharp, fast, sometimes lingering, and throbbing. Differential diagnosis includes incisional or inguinal hernia. No testicular pain or scrotal swelling reported. CT scan is proposed to evaluate for possible hernia. - Order CT scan of the abdominal area with contrast to evaluate for possible hernia.  Paranoia Paranoia with current treatment including olanzapine and Atarax. He expresses interest in trying medications like Adderall and Zoloft. Discussion about the appropriateness of different medications for paranoia, ADHD, depression, and PTSD. Referral to psychiatry is planned for further evaluation and management. - Refer to psychiatry for further evaluation and management of paranoia and medication adjustment.  General Health Maintenance He is 53 years old and due for routine health screenings including colonoscopy for cancer screening, prostate screening, cholesterol, kidney function, blood sugar for diabetes, and urine analysis. Last screenings were about a year  ago. Colonoscopy is emphasized for cancer screening at his age. - Refer for colonoscopy for cancer screening. - Order basic lab work including cholesterol, kidney function, blood sugar, and urine analysis.  Follow-up Follow-up plans discussed to monitor knee pain and complete physical examination with fasting lab work. He is advised to seek emergency care if severe pain or vomiting occurs, indicating possible hernia complications. - Schedule follow-up appointment in one month for knee pain evaluation and physical examination with fasting lab work. - Ensure he receives referrals for gastroenterology and psychiatry. - Advise him to go to the emergency department if severe pain or vomiting occurs, indicating possible hernia complications.       Subjective:   Encounter date: 03/25/2024  Chief Complaint  Patient presents with   Annual Exam    Patient would like to discuss colonoscopy options     Discussed the use of AI scribe software for clinical note transcription with the patient, who gave verbal consent to proceed.  History of Present Illness Benjamin Shields is a 53 year old male who presents for an annual physical exam.  He has been experiencing issues with his right knee for the past couple of weeks, characterized by locking and giving out unexpectedly. The discomfort is primarily around the patella and on the sides. He denies any specific injury but attributes the symptoms to wear and tear from playing basketball. He has tried acetaminophen and ibuprofen without relief. Previous x-rays in February 2023 showed no abnormalities. He is not currently using any medications for the knee pain.  He reports ongoing pain at the site of a previous varicocele surgery in the scrotum area, performed in 1997. The pain is described as sharp, fast, sometimes lingering, and occasionally throbbing, located at the incision site. No testicular pain, bulging, or scrotal swelling is noted.  He is currently  taking olanzapine 5 mg and hydroxyzine for mood-related issues, including paranoia, ADHD, depression, and PTSD. He has not yet noticed significant changes in his mood with these medications.       02/02/2024    3:06 PM  Depression screen PHQ 2/9  Decreased Interest 0  Down, Depressed, Hopeless 2  PHQ - 2 Score 2  Altered sleeping 2  Tired, decreased energy 2  Change in appetite 0  Feeling bad or failure about yourself  0  Trouble concentrating 0  Moving slowly or fidgety/restless 0  Suicidal thoughts 0  PHQ-9 Score 6  Difficult doing work/chores Not difficult at all       02/02/2024    3:06 PM  GAD 7 : Generalized Anxiety Score  Nervous, Anxious, on Edge 3  Control/stop worrying 3  Worry too much - different things 3  Trouble relaxing 3  Restless 3  Easily annoyed or irritable 3  Afraid - awful might happen 3  Total GAD 7 Score 21  Anxiety Difficulty Not difficult at all    Health Maintenance Due  Topic Date Due   Hepatitis C Screening  Never done   Colonoscopy  Never done       PMH:  The following were reviewed and entered/updated in epic: Past Medical History:  Diagnosis Date   Asthma    Diabetes mellitus without complication (HCC)    High cholesterol    Hypertension    Pericarditis    Thyroid disease     There are no active problems to display for this patient.   History reviewed. No pertinent surgical history.  History reviewed. No pertinent family history.  Medications- reviewed and updated Outpatient Medications Prior to Visit  Medication Sig Dispense Refill   albuterol (VENTOLIN HFA) 108 (90 Base) MCG/ACT inhaler Inhale 2 puffs into the lungs every 6 (six) hours as needed for wheezing or shortness of breath. 8 g 0   fluticasone (FLOVENT HFA) 110 MCG/ACT inhaler Inhale 2 puffs into the lungs 2 (two) times daily. 1 Inhaler 12   hydrOXYzine (ATARAX) 10 MG tablet Take 2.5 tablets (25 mg total) by mouth 3 (three) times daily. 20 tablet 0    OLANZapine (ZYPREXA) 5 MG tablet Take 1 tablet (5 mg total) by mouth at bedtime. 3 tablet 0   No facility-administered medications prior to visit.    Allergies  Allergen Reactions   Banana    Cat Dander Swelling   Penicillins     Pt states he can take amoxicillin without complication    Social History   Socioeconomic History   Marital status: Single    Spouse name: Not on file   Number of children: Not on file   Years of education: Not on file   Highest education level: Not on file  Occupational History   Not on file  Tobacco Use   Smoking status: Some Days    Types: Cigarettes   Smokeless tobacco: Never  Substance and Sexual Activity   Alcohol use: Yes    Comment: rarely   Drug use: Yes    Types: Marijuana   Sexual activity: Not on file  Other Topics Concern   Not on file  Social History Narrative   Not on file   Social Drivers of Health   Financial Resource Strain: Not on file  Food Insecurity: Not on file  Transportation Needs: Not on file  Physical Activity: Not on file  Stress: Not on file  Social Connections: Unknown (  05/13/2022)   Received from [facility]   Social Network    Social Network: Not on file           Objective:  Physical Exam: BP 128/78   Pulse 74   Temp (!) 97 F (36.1 C) (Temporal)   Ht 5' 9.5" (1.765 m)   Wt 157 lb 6.4 oz (71.4 kg)   SpO2 97%   BMI 22.91 kg/m   Body mass index is 22.91 kg/m. Wt Readings from Last 3 Encounters:  03/25/24 157 lb 6.4 oz (71.4 kg)  02/02/24 148 lb 6.4 oz (67.3 kg)  01/06/24 148 lb (67.1 kg)    Physical Exam GENERAL: Alert, cooperative, well developed, no acute distress HEENT: Normocephalic, normal oropharynx, moist mucous membranes CHEST: Clear to auscultation bilaterally, No wheezes, rhonchi, or crackles CARDIOVASCULAR: Normal heart rate and rhythm, S1 and S2 normal without murmurs ABDOMEN: Soft, non-tender, non-distended, without organomegaly, Normal bowel sounds GENITOURINARY:  Slight tenderness at incision site in groin EXTREMITIES: No cyanosis or edema MUSCULOSKELETAL: Right Knee stable without ligamentous laxity, Patella stable NEUROLOGICAL: Cranial nerves grossly intact, Moves all extremities without gross motor or sensory deficit        Prior labs:   Recent Results (from the past 2160 hours)  POC COVID-19 BinaxNow     Status: None   Collection Time: 02/02/24  2:31 PM  Result Value Ref Range   SARS Coronavirus 2 Ag Negative Negative  POCT Influenza A/B     Status: Abnormal   Collection Time: 02/02/24  2:31 PM  Result Value Ref Range   Influenza A, POC Positive (A) Negative   Influenza B, POC Negative Negative    No results found for: "CHOL" No results found for: "HDL" No results found for: "LDLCALC" No results found for: "TRIG" No results found for: "CHOLHDL" No results found for: "LDLDIRECT"  Last metabolic panel Lab Results  Component Value Date   GLUCOSE 101 (H) 09/04/2023   NA 138 09/04/2023   K 3.6 09/04/2023   CL 105 09/04/2023   CO2 24 09/04/2023   BUN 9 09/04/2023   CREATININE 1.18 09/04/2023   GFRNONAA >60 09/04/2023   CALCIUM 8.6 (L) 09/04/2023   PROT 5.4 (L) 09/04/2023   ALBUMIN 3.3 (L) 09/04/2023   BILITOT 0.6 09/04/2023   ALKPHOS 61 09/04/2023   AST 20 09/04/2023   ALT 15 09/04/2023   ANIONGAP 9 09/04/2023    No results found for: "HGBA1C"  Last CBC Lab Results  Component Value Date   WBC 4.1 09/04/2023   HGB 13.5 09/04/2023   HCT 39.5 09/04/2023   MCV 91.4 09/04/2023   MCH 31.3 09/04/2023   RDW 13.8 09/04/2023   PLT 227 09/04/2023    No results found for: "TSH"  No results found for: "PSA1", "PSA"  Last vitamin D No results found for: "25OHVITD2", "25OHVITD3", "VD25OH"  Lab Results  Component Value Date   BILIRUBINUR NEGATIVE 01/14/2018   PROTEINUR NEGATIVE 01/14/2018   LEUKOCYTESUR NEGATIVE 01/14/2018    No results found for: "LABMICR", "MICROALBUR"   At today's visit, we discussed  treatment options, associated risk and benefits, and engage in counseling as needed.  Additionally the following were reviewed: Past medical records, past medical and surgical history, family and social background, as well as relevant laboratory results, imaging findings, and specialty notes, where applicable.  This message was generated using dictation software, and as a result, it may contain unintentional typos or errors.  Nevertheless, extensive effort was made to accurately convey at  the pertinent aspects of the patient visit.    There may have been are other unrelated non-urgent complaints, but due to the busy schedule and the amount of time already spent with him, time does not permit to address these issues at today's visit. Another appointment may have or has been requested to review these additional issues.     Thomes Dinning, MD, MS

## 2024-04-10 ENCOUNTER — Ambulatory Visit (HOSPITAL_BASED_OUTPATIENT_CLINIC_OR_DEPARTMENT_OTHER): Payer: MEDICAID

## 2024-04-13 ENCOUNTER — Ambulatory Visit: Payer: MEDICAID | Attending: Family Medicine

## 2024-04-13 NOTE — Therapy (Deleted)
 OUTPATIENT PHYSICAL THERAPY LOWER EXTREMITY EVALUATION   Patient Name: Benjamin Shields MRN: [id number] DOB:01-Jul-1971, 53 y.o., male Today's Date: 04/13/2024  END OF SESSION:   Past Medical History:  Diagnosis Date   Asthma    Diabetes mellitus without complication (HCC)    High cholesterol    Hypertension    Pericarditis    Thyroid disease    No past surgical history on file. There are no active problems to display for this patient.   PCP: Fanny Bien, MD  REFERRING PROVIDER: same  REFERRING DIAG: chronic pain of R knee  THERAPY DIAG:  No diagnosis found.  Rationale for Evaluation and Treatment: Rehabilitation  ONSET DATE: crhonic  SUBJECTIVE:   SUBJECTIVE STATEMENT: ***  PERTINENT HISTORY: *** PAIN:  Are you having pain? {OPRCPAIN:27236}  PRECAUTIONS: None  RED FLAGS: None   WEIGHT BEARING RESTRICTIONS: No  FALLS:  Has patient fallen in last 6 months? {fallsyesno:27318}  LIVING ENVIRONMENT: Lives with: {OPRC lives with:25569::"lives with their family"} Lives in: {Lives in:25570} Stairs: {opstairs:27293} Has following equipment at home: {Assistive devices:23999}  OCCUPATION: ***  PLOF: {PLOF:24004}  PATIENT GOALS: ***  NEXT MD VISIT: ***  OBJECTIVE:  Note: Objective measures were completed at Evaluation unless otherwise noted.  DIAGNOSTIC FINDINGS: x rays ordered for knee but not performed yet  PATIENT SURVEYS:  Tegler Lysholm  COGNITION: Overall cognitive status: Within functional limits for tasks assessed     SENSATION: {sensation:27233}  EDEMA:  {edema:24020}  MUSCLE LENGTH: Hamstrings: Right *** deg; Left *** deg Maisie Fus test: Right *** deg; Left *** deg  POSTURE: {posture:25561}  PALPATION: ***  LOWER EXTREMITY ROM:  {AROM/PROM:27142} ROM Right eval Left eval  Hip flexion    Hip extension    Hip abduction    Hip adduction    Hip internal rotation    Hip external rotation    Knee flexion    Knee extension     Ankle dorsiflexion    Ankle plantarflexion    Ankle inversion    Ankle eversion     (Blank rows = not tested)  LOWER EXTREMITY MMT:  MMT Right eval Left eval  Hip flexion    Hip extension    Hip abduction    Hip adduction    Hip internal rotation    Hip external rotation    Knee flexion    Knee extension    Ankle dorsiflexion    Ankle plantarflexion    Ankle inversion    Ankle eversion     (Blank rows = not tested)  LOWER EXTREMITY SPECIAL TESTS:  {LEspecialtests:26242}  FUNCTIONAL TESTS:  30 seconds chair stand test  GAIT: Distance walked: *** Assistive device utilized: {Assistive devices:23999} Level of assistance: {Levels of assistance:24026} Comments: ***                                                                                                                                TREATMENT DATE: 04/13/24  PATIENT EDUCATION:  Education details: *** Person educated: {Person educated:25204} Education method: {Education Method:25205} Education comprehension: {Education Comprehension:25206}  HOME EXERCISE PROGRAM: ***  ASSESSMENT:  CLINICAL IMPRESSION: Patient is a 53 y.o. male who was evaluated today by physical therapy  for chronic knee pain.   OBJECTIVE IMPAIRMENTS: {opptimpairments:25111}.   ACTIVITY LIMITATIONS: {activitylimitations:27494}  PARTICIPATION LIMITATIONS: {participationrestrictions:25113}  PERSONAL FACTORS: {Personal factors:25162} are also affecting patient's functional outcome.   REHAB POTENTIAL: Good  CLINICAL DECISION MAKING: Evolving/moderate complexity  EVALUATION COMPLEXITY: Moderate   GOALS: Goals reviewed with patient? Yes  SHORT TERM GOALS: Target date: 2 weeks, 04/27/24 I HEP Baseline: Goal status: INITIAL   LONG TERM GOALS: Target date: 06/08/24, 8 weeks   *** Baseline:  Goal status: INITIAL  2.  *** Baseline:  Goal status: INITIAL  3.  *** Baseline:  Goal status: INITIAL  4.  *** Baseline:   Goal status: INITIAL  5.  *** Baseline:  Goal status: INITIAL  6.  *** Baseline:  Goal status: INITIAL   PLAN:  PT FREQUENCY: {rehab frequency:25116}  PT DURATION: {rehab duration:25117}  PLANNED INTERVENTIONS: {rehab planned interventions:25118::"97110-Therapeutic exercises","97530- Therapeutic [phone]- Neuromuscular re-education","97535- Self ZSWF","09323- Manual therapy"}  PLAN FOR NEXT SESSION: ***   Jerrell Mangel L Sherisa Gilvin, PT 04/13/2024, 12:31 PM

## 2024-04-19 ENCOUNTER — Ambulatory Visit (HOSPITAL_COMMUNITY): Admission: RE | Admit: 2024-04-19 | Payer: MEDICAID | Source: Ambulatory Visit

## 2024-04-26 ENCOUNTER — Ambulatory Visit: Payer: MEDICAID

## 2024-04-30 ENCOUNTER — Other Ambulatory Visit (HOSPITAL_BASED_OUTPATIENT_CLINIC_OR_DEPARTMENT_OTHER): Payer: MEDICAID

## 2024-06-01 DIAGNOSIS — Z5321 Procedure and treatment not carried out due to patient leaving prior to being seen by health care provider: Secondary | ICD-10-CM | POA: Insufficient documentation

## 2024-06-01 DIAGNOSIS — T189XXA Foreign body of alimentary tract, part unspecified, initial encounter: Secondary | ICD-10-CM | POA: Diagnosis present

## 2024-06-01 DIAGNOSIS — W44C0XA Glass unspecified, entering into or through a natural orifice, initial encounter: Secondary | ICD-10-CM | POA: Diagnosis not present

## 2024-06-02 ENCOUNTER — Emergency Department (HOSPITAL_COMMUNITY): Payer: MEDICAID

## 2024-06-02 ENCOUNTER — Emergency Department (HOSPITAL_COMMUNITY)
Admission: EM | Admit: 2024-06-02 | Discharge: 2024-06-02 | Discharge status: Against Medical Advice | Payer: MEDICAID | Attending: Emergency Medicine | Admitting: Emergency Medicine

## 2024-06-02 ENCOUNTER — Encounter (HOSPITAL_COMMUNITY): Payer: Self-pay | Admitting: *Deleted

## 2024-06-02 ENCOUNTER — Other Ambulatory Visit: Payer: Self-pay

## 2024-06-02 MED ORDER — ALUM & MAG HYDROXIDE-SIMETH 200-200-20 MG/5ML PO SUSP
30.0000 mL | Freq: Once | ORAL | Status: AC
  Administered 2024-06-02: 30 mL via ORAL
  Filled 2024-06-02: qty 30
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
## 2024-06-02 NOTE — ED Provider Triage Note (Signed)
  Emergency Medicine Provider Triage Evaluation Note  MRN:  [id number]  Arrival date & time: 06/02/24    Medically screening exam initiated at 12:28 AM.   CC:   swallowed glass   HPI:  Faheem Ziemann is a 53 y.o. year-old male presents to the ED with chief complaint of swallowed glass.  States that he was drinking and chipped a piece of glass and thinks he swallowed it.  History provided by patient ROS:  -As included in HPI PE:  There were no vitals filed for this visit.  Non-toxic appearing No respiratory distress  MDM:  Based on signs and symptoms, swallowed FB is highest on my differential. I've ordered gi cocktail in triage to expedite lab/diagnostic workup.  Patient was informed that the remainder of the evaluation will be completed by another provider, this initial triage assessment does not replace that evaluation, and the importance of remaining in the ED until their evaluation is complete.    Sherel Dikes, PA-C 06/02/24 [phone]

## 2024-06-02 NOTE — ED Notes (Signed)
 Pt left and went home

## 2024-06-03 ENCOUNTER — Emergency Department (HOSPITAL_BASED_OUTPATIENT_CLINIC_OR_DEPARTMENT_OTHER)
Admission: EM | Admit: 2024-06-03 | Discharge: 2024-06-03 | Disposition: A | Discharge status: Home / Self Care | Payer: MEDICAID | Attending: Emergency Medicine | Admitting: Emergency Medicine

## 2024-06-03 ENCOUNTER — Emergency Department (HOSPITAL_BASED_OUTPATIENT_CLINIC_OR_DEPARTMENT_OTHER): Payer: MEDICAID

## 2024-06-03 ENCOUNTER — Emergency Department (HOSPITAL_BASED_OUTPATIENT_CLINIC_OR_DEPARTMENT_OTHER): Payer: MEDICAID | Admitting: Radiology

## 2024-06-03 ENCOUNTER — Encounter (HOSPITAL_BASED_OUTPATIENT_CLINIC_OR_DEPARTMENT_OTHER): Payer: Self-pay | Admitting: Emergency Medicine

## 2024-06-03 DIAGNOSIS — I10 Essential (primary) hypertension: Secondary | ICD-10-CM | POA: Insufficient documentation

## 2024-06-03 DIAGNOSIS — M79621 Pain in right upper arm: Secondary | ICD-10-CM | POA: Diagnosis not present

## 2024-06-03 DIAGNOSIS — Z7951 Long term (current) use of inhaled steroids: Secondary | ICD-10-CM | POA: Diagnosis not present

## 2024-06-03 DIAGNOSIS — J45909 Unspecified asthma, uncomplicated: Secondary | ICD-10-CM | POA: Insufficient documentation

## 2024-06-03 DIAGNOSIS — E119 Type 2 diabetes mellitus without complications: Secondary | ICD-10-CM | POA: Insufficient documentation

## 2024-06-03 DIAGNOSIS — F17290 Nicotine dependence, other tobacco product, uncomplicated: Secondary | ICD-10-CM | POA: Insufficient documentation

## 2024-06-03 DIAGNOSIS — M545 Low back pain, unspecified: Secondary | ICD-10-CM | POA: Diagnosis not present

## 2024-06-03 DIAGNOSIS — Y92410 Unspecified street and highway as the place of occurrence of the external cause: Secondary | ICD-10-CM | POA: Diagnosis not present

## 2024-06-03 DIAGNOSIS — M25551 Pain in right hip: Secondary | ICD-10-CM | POA: Diagnosis not present

## 2024-06-03 DIAGNOSIS — M542 Cervicalgia: Secondary | ICD-10-CM | POA: Insufficient documentation

## 2024-06-03 MED ORDER — CELECOXIB 200 MG PO CAPS: 200.0000 mg | ORAL_CAPSULE | Freq: Two times a day (BID) | ORAL | 0 refills | Status: DC | PRN

## 2024-06-03 MED ORDER — CYCLOBENZAPRINE HCL 10 MG PO TABS: 10.0000 mg | ORAL_TABLET | Freq: Two times a day (BID) | ORAL | 0 refills | Status: AC | PRN

## 2024-06-03 MED ORDER — KETOROLAC TROMETHAMINE 30 MG/ML IJ SOLN
30.0000 mg | Freq: Once | INTRAMUSCULAR | Status: AC
  Administered 2024-06-03: 30 mg via INTRAMUSCULAR
  Filled 2024-06-03: qty 1

## 2024-06-03 NOTE — Discharge Instructions (Addendum)
 Your workup today was reassuring.  Imaging of your back, right hip, right arm, neck all appeared normal.  Was needed with anti-inflammatory as well as muscle laxer to use to treat your symptoms.  Muscle relaxer can cause drowsiness so please do not drive or perform any high-risk activity until you realize this medications effects on you.  Recommend follow-up with your primary care for reassessment.  Please do not hesitate to return to the emergency department if the worrisome signs and symptoms we discussed become apparent.

## 2024-06-03 NOTE — ED Notes (Signed)
 RN reviewed discharge instructions with pt. Pt verbalized understanding and had no further questions. VSS upon discharge.
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
## 2024-06-08 ENCOUNTER — Encounter: Payer: Self-pay | Admitting: Family Medicine

## 2024-07-13 ENCOUNTER — Other Ambulatory Visit: Payer: Self-pay | Admitting: Family Medicine

## 2024-07-13 DIAGNOSIS — G8929 Other chronic pain: Secondary | ICD-10-CM

## 2024-07-14 NOTE — Telephone Encounter (Signed)
 Requesting: MELOXICAM  15MG  TABLETS  Last Visit: 03/25/2024 Next Visit: Visit date not found Last Refill: 03/25/2024  Please Advise

## 2024-09-07 ENCOUNTER — Ambulatory Visit: Payer: MEDICAID | Admitting: Family Medicine

## 2024-09-14 ENCOUNTER — Telehealth: Payer: Self-pay | Admitting: Family Medicine

## 2024-09-14 NOTE — Telephone Encounter (Signed)
 03/02/2024 same day cancel/transportation 09/07/2024 no show  Final warning sent via mail and mychart

## 2024-10-10 ENCOUNTER — Emergency Department (HOSPITAL_COMMUNITY): Payer: MEDICAID

## 2024-10-10 ENCOUNTER — Inpatient Hospital Stay
Admission: RE | Admit: 2024-10-10 | Discharge: 2024-10-17 | DRG: 885 | Disposition: A | Discharge status: Home / Self Care | Payer: MEDICAID | Source: Intra-hospital | Attending: Psychiatry | Admitting: Psychiatry

## 2024-10-10 ENCOUNTER — Emergency Department (HOSPITAL_COMMUNITY)
Admission: EM | Admit: 2024-10-10 | Discharge: 2024-10-10 | Disposition: A | Discharge status: Other Acute Inpatient Hospital | Payer: MEDICAID | Attending: Emergency Medicine | Admitting: Emergency Medicine

## 2024-10-10 ENCOUNTER — Encounter: Payer: Self-pay | Admitting: Psychiatry

## 2024-10-10 ENCOUNTER — Encounter (HOSPITAL_COMMUNITY): Payer: Self-pay

## 2024-10-10 ENCOUNTER — Other Ambulatory Visit: Payer: Self-pay

## 2024-10-10 DIAGNOSIS — F322 Major depressive disorder, single episode, severe without psychotic features: Secondary | ICD-10-CM | POA: Diagnosis present

## 2024-10-10 DIAGNOSIS — F17290 Nicotine dependence, other tobacco product, uncomplicated: Secondary | ICD-10-CM | POA: Diagnosis present

## 2024-10-10 DIAGNOSIS — Z9151 Personal history of suicidal behavior: Secondary | ICD-10-CM | POA: Diagnosis not present

## 2024-10-10 DIAGNOSIS — F1210 Cannabis abuse, uncomplicated: Secondary | ICD-10-CM | POA: Insufficient documentation

## 2024-10-10 DIAGNOSIS — F17210 Nicotine dependence, cigarettes, uncomplicated: Secondary | ICD-10-CM | POA: Diagnosis present

## 2024-10-10 DIAGNOSIS — Z91018 Allergy to other foods: Secondary | ICD-10-CM

## 2024-10-10 DIAGNOSIS — Z91411 Personal history of adult psychological abuse: Secondary | ICD-10-CM

## 2024-10-10 DIAGNOSIS — R400 Somnolence: Secondary | ICD-10-CM | POA: Diagnosis present

## 2024-10-10 DIAGNOSIS — Z79899 Other long term (current) drug therapy: Secondary | ICD-10-CM

## 2024-10-10 DIAGNOSIS — T71162A Asphyxiation due to hanging, intentional self-harm, initial encounter: Secondary | ICD-10-CM | POA: Insufficient documentation

## 2024-10-10 DIAGNOSIS — T71164A Asphyxiation due to hanging, undetermined, initial encounter: Secondary | ICD-10-CM | POA: Diagnosis not present

## 2024-10-10 DIAGNOSIS — Z818 Family history of other mental and behavioral disorders: Secondary | ICD-10-CM

## 2024-10-10 DIAGNOSIS — T71191A Asphyxiation due to mechanical threat to breathing due to other causes, accidental, initial encounter: Secondary | ICD-10-CM | POA: Diagnosis not present

## 2024-10-10 DIAGNOSIS — F1290 Cannabis use, unspecified, uncomplicated: Secondary | ICD-10-CM | POA: Diagnosis present

## 2024-10-10 DIAGNOSIS — Z7951 Long term (current) use of inhaled steroids: Secondary | ICD-10-CM | POA: Insufficient documentation

## 2024-10-10 DIAGNOSIS — F329 Major depressive disorder, single episode, unspecified: Secondary | ICD-10-CM

## 2024-10-10 DIAGNOSIS — F1510 Other stimulant abuse, uncomplicated: Secondary | ICD-10-CM | POA: Diagnosis not present

## 2024-10-10 DIAGNOSIS — T1491XA Suicide attempt, initial encounter: Secondary | ICD-10-CM | POA: Diagnosis not present

## 2024-10-10 DIAGNOSIS — Z91410 Personal history of adult physical and sexual abuse: Secondary | ICD-10-CM

## 2024-10-10 DIAGNOSIS — T405X2A Poisoning by cocaine, intentional self-harm, initial encounter: Secondary | ICD-10-CM | POA: Diagnosis present

## 2024-10-10 DIAGNOSIS — E119 Type 2 diabetes mellitus without complications: Secondary | ICD-10-CM | POA: Diagnosis present

## 2024-10-10 DIAGNOSIS — Y900 Blood alcohol level of less than 20 mg/100 ml: Secondary | ICD-10-CM | POA: Diagnosis not present

## 2024-10-10 DIAGNOSIS — E7800 Pure hypercholesterolemia, unspecified: Secondary | ICD-10-CM | POA: Diagnosis present

## 2024-10-10 DIAGNOSIS — F332 Major depressive disorder, recurrent severe without psychotic features: Secondary | ICD-10-CM | POA: Diagnosis not present

## 2024-10-10 DIAGNOSIS — Z560 Unemployment, unspecified: Secondary | ICD-10-CM

## 2024-10-10 DIAGNOSIS — R5383 Other fatigue: Secondary | ICD-10-CM | POA: Diagnosis present

## 2024-10-10 DIAGNOSIS — Z791 Long term (current) use of non-steroidal anti-inflammatories (NSAID): Secondary | ICD-10-CM | POA: Diagnosis not present

## 2024-10-10 DIAGNOSIS — J45909 Unspecified asthma, uncomplicated: Secondary | ICD-10-CM | POA: Diagnosis present

## 2024-10-10 DIAGNOSIS — R4587 Impulsiveness: Secondary | ICD-10-CM | POA: Insufficient documentation

## 2024-10-10 DIAGNOSIS — F1410 Cocaine abuse, uncomplicated: Secondary | ICD-10-CM | POA: Insufficient documentation

## 2024-10-10 DIAGNOSIS — G928 Other toxic encephalopathy: Secondary | ICD-10-CM | POA: Diagnosis present

## 2024-10-10 DIAGNOSIS — T43622A Poisoning by amphetamines, intentional self-harm, initial encounter: Secondary | ICD-10-CM | POA: Diagnosis present

## 2024-10-10 DIAGNOSIS — I10 Essential (primary) hypertension: Secondary | ICD-10-CM | POA: Diagnosis present

## 2024-10-10 DIAGNOSIS — Z880 Allergy status to penicillin: Secondary | ICD-10-CM | POA: Diagnosis not present

## 2024-10-10 DIAGNOSIS — F419 Anxiety disorder, unspecified: Secondary | ICD-10-CM | POA: Insufficient documentation

## 2024-10-10 DIAGNOSIS — Z9109 Other allergy status, other than to drugs and biological substances: Secondary | ICD-10-CM

## 2024-10-10 DIAGNOSIS — T450X5A Adverse effect of antiallergic and antiemetic drugs, initial encounter: Secondary | ICD-10-CM | POA: Diagnosis present

## 2024-10-10 DIAGNOSIS — F515 Nightmare disorder: Secondary | ICD-10-CM | POA: Diagnosis present

## 2024-10-10 DIAGNOSIS — E1165 Type 2 diabetes mellitus with hyperglycemia: Secondary | ICD-10-CM | POA: Insufficient documentation

## 2024-10-10 DIAGNOSIS — F1490 Cocaine use, unspecified, uncomplicated: Secondary | ICD-10-CM | POA: Diagnosis present

## 2024-10-10 LAB — CBC WITH DIFFERENTIAL/PLATELET
Abs Immature Granulocytes: 0.01 K/uL (ref 0.00–0.07)
Basophils Absolute: 0.1 K/uL (ref 0.0–0.1)
Basophils Relative: 1 %
Eosinophils Absolute: 0 K/uL (ref 0.0–0.5)
Eosinophils Relative: 0 %
HCT: 43.9 % (ref 39.0–52.0)
Hemoglobin: 14.9 g/dL (ref 13.0–17.0)
Immature Granulocytes: 0 %
Lymphocytes Relative: 22 %
Lymphs Abs: 1.4 K/uL (ref 0.7–4.0)
MCH: 30.7 pg (ref 26.0–34.0)
MCHC: 33.9 g/dL (ref 30.0–36.0)
MCV: 90.5 fL (ref 80.0–100.0)
Monocytes Absolute: 0.4 K/uL (ref 0.1–1.0)
Monocytes Relative: 5 %
Neutro Abs: 4.6 K/uL (ref 1.7–7.7)
Neutrophils Relative %: 72 %
Platelets: 282 K/uL (ref 150–400)
RBC: 4.85 MIL/uL (ref 4.22–5.81)
RDW: 13.7 % (ref 11.5–15.5)
WBC: 6.5 K/uL (ref 4.0–10.5)
nRBC: 0 % (ref 0.0–0.2)

## 2024-10-10 LAB — COMPREHENSIVE METABOLIC PANEL WITH GFR
ALT: 12 U/L (ref 0–44)
AST: 21 U/L (ref 15–41)
Albumin: 3.6 g/dL (ref 3.5–5.0)
Alkaline Phosphatase: 61 U/L (ref 38–126)
Anion gap: 11 (ref 5–15)
BUN: 8 mg/dL (ref 6–20)
CO2: 22 mmol/L (ref 22–32)
Calcium: 9 mg/dL (ref 8.9–10.3)
Chloride: 107 mmol/L (ref 98–111)
Creatinine, Ser: 1.32 mg/dL — ABNORMAL HIGH (ref 0.61–1.24)
GFR, Estimated: >60 mL/min (ref >60)
Glucose, Bld: 118 mg/dL — ABNORMAL HIGH (ref 70–99)
Potassium: 4 mmol/L (ref 3.5–5.1)
Sodium: 140 mmol/L (ref 135–145)
Total Bilirubin: 0.8 mg/dL (ref 0.0–1.2)
Total Protein: 5.9 g/dL — ABNORMAL LOW (ref 6.5–8.1)

## 2024-10-10 LAB — LIPASE, BLOOD: Lipase: 21 U/L (ref 11–51)

## 2024-10-10 LAB — RAPID URINE DRUG SCREEN, HOSP PERFORMED
Amphetamines: POSITIVE — AB
Barbiturates: NOT DETECTED
Benzodiazepines: NOT DETECTED
Cocaine: POSITIVE — AB
Opiates: NOT DETECTED
Tetrahydrocannabinol: POSITIVE — AB

## 2024-10-10 LAB — TROPONIN I (HIGH SENSITIVITY)
Troponin I (High Sensitivity): 3 ng/L (ref <18)
Troponin I (High Sensitivity): 3 ng/L (ref <18)

## 2024-10-10 LAB — ETHANOL: Alcohol, Ethyl (B): <15 mg/dL (ref <15)

## 2024-10-10 LAB — ACETAMINOPHEN LEVEL: Acetaminophen (Tylenol), Serum: <10 ug/mL — ABNORMAL LOW (ref 10–30)

## 2024-10-10 LAB — CK: Total CK: 108 U/L (ref 49–397)

## 2024-10-10 LAB — SALICYLATE LEVEL: Salicylate Lvl: <7 mg/dL — ABNORMAL LOW (ref 7.0–30.0)

## 2024-10-10 LAB — CBG MONITORING, ED: Glucose-Capillary: 108 mg/dL — ABNORMAL HIGH (ref 70–99)

## 2024-10-10 LAB — TSH: TSH: 1.318 u[IU]/mL (ref 0.350–4.500)

## 2024-10-10 MED ORDER — DIPHENHYDRAMINE HCL 25 MG PO CAPS: 50.0000 mg | ORAL_CAPSULE | Freq: Three times a day (TID) | ORAL | Status: DC | PRN

## 2024-10-10 MED ORDER — NICOTINE 21 MG/24HR TD PT24
21.0000 mg | MEDICATED_PATCH | Freq: Every day | TRANSDERMAL | Status: DC
  Filled 2024-10-10 (×2): qty 1

## 2024-10-10 MED ORDER — HALOPERIDOL LACTATE 5 MG/ML IJ SOLN: 5.0000 mg | Freq: Three times a day (TID) | INTRAMUSCULAR | Status: DC | PRN

## 2024-10-10 MED ORDER — ALUM & MAG HYDROXIDE-SIMETH 200-200-20 MG/5ML PO SUSP: 30.0000 mL | ORAL | Status: DC | PRN

## 2024-10-10 MED ORDER — TRAZODONE HCL 50 MG PO TABS: 50.0000 mg | ORAL_TABLET | Freq: Every evening | ORAL | Status: DC | PRN

## 2024-10-10 MED ORDER — SODIUM CHLORIDE 0.9 % IV BOLUS
500.0000 mL | Freq: Once | INTRAVENOUS | Status: AC
  Administered 2024-10-10: 500 mL via INTRAVENOUS

## 2024-10-10 MED ORDER — HALOPERIDOL 5 MG PO TABS: 5.0000 mg | ORAL_TABLET | Freq: Three times a day (TID) | ORAL | Status: DC | PRN

## 2024-10-10 MED ORDER — METOCLOPRAMIDE HCL 10 MG PO TABS
10.0000 mg | ORAL_TABLET | Freq: Once | ORAL | Status: AC
Start: 2024-10-10 — End: 2024-10-10
  Administered 2024-10-10: 10 mg via ORAL
  Filled 2024-10-10: qty 1

## 2024-10-10 MED ORDER — DIPHENHYDRAMINE HCL 25 MG PO CAPS
25.0000 mg | ORAL_CAPSULE | Freq: Once | ORAL | Status: AC
Start: 2024-10-10 — End: 2024-10-10
  Administered 2024-10-10: 25 mg via ORAL
  Filled 2024-10-10: qty 1

## 2024-10-10 MED ORDER — LORAZEPAM 2 MG/ML IJ SOLN: 2.0000 mg | Freq: Three times a day (TID) | INTRAMUSCULAR | Status: DC | PRN

## 2024-10-10 MED ORDER — DIPHENHYDRAMINE HCL 50 MG/ML IJ SOLN: 50.0000 mg | Freq: Three times a day (TID) | INTRAMUSCULAR | Status: DC | PRN

## 2024-10-10 MED ORDER — IOHEXOL 350 MG/ML SOLN
75.0000 mL | Freq: Once | INTRAVENOUS | Status: AC | PRN
  Administered 2024-10-10: 75 mL via INTRAVENOUS

## 2024-10-10 MED ORDER — ACETAMINOPHEN 325 MG PO TABS
650.0000 mg | ORAL_TABLET | Freq: Four times a day (QID) | ORAL | Status: DC | PRN
  Administered 2024-10-12: 650 mg via ORAL
  Filled 2024-10-10: qty 2

## 2024-10-10 MED ORDER — HALOPERIDOL LACTATE 5 MG/ML IJ SOLN: 10.0000 mg | Freq: Three times a day (TID) | INTRAMUSCULAR | Status: DC | PRN

## 2024-10-10 MED ORDER — MAGNESIUM HYDROXIDE 400 MG/5ML PO SUSP: 30.0000 mL | Freq: Every day | ORAL | Status: DC | PRN

## 2024-10-10 MED ORDER — ALUM & MAG HYDROXIDE-SIMETH 200-200-20 MG/5ML PO SUSP: 30.0000 mL | Freq: Four times a day (QID) | ORAL | Status: DC | PRN

## 2024-10-10 MED ORDER — NICOTINE 21 MG/24HR TD PT24: 21.0000 mg | MEDICATED_PATCH | Freq: Every day | TRANSDERMAL | Status: DC

## 2024-10-10 MED ORDER — ONDANSETRON HCL 4 MG PO TABS: 4.0000 mg | ORAL_TABLET | Freq: Three times a day (TID) | ORAL | Status: DC | PRN

## 2024-10-10 MED ORDER — HYDROXYZINE HCL 25 MG PO TABS: 25.0000 mg | ORAL_TABLET | Freq: Three times a day (TID) | ORAL | Status: DC | PRN

## 2024-10-10 MED ORDER — ACETAMINOPHEN 325 MG PO TABS: 650.0000 mg | ORAL_TABLET | ORAL | Status: DC | PRN

## 2024-10-10 NOTE — Social Work (Signed)
 Pt has been accepted to [facility] on 10/10/2024 Bed assignment: 303   Pt meets inpatient criteria per: Elveria Batter, NP   Attending Physician will be: Dr. Sree Jadapalle   Report can be called to: [phone]   Pt can arrive: After discharge  Care Team Notified: [facility] Cherylynn Ernst RN, Elveria Batter NP, [place] Nickerson RN      Chesley Holt, [facility]    10/10/2024 1:47 PM

## 2024-10-10 NOTE — ED Notes (Signed)
 Pt returned from CT scan.

## 2024-10-10 NOTE — Progress Notes (Signed)
 At the start of the shift Pt was visiting with a male he identified as his girlfriend and that he was sitting close to her and holding onto her.  As she was about to leave after visitation, Pt held onto to her so tightly requiring several prompting to let go of her.  Pt had difficulty ambulating independently - ambulation assisted by two staff even though he was using a walker. His gait is unsteady, presents lethargic and unable to focus and keep a conversation without falling into a daze. Pt denied SI/H and depression; endorsed anxiety rated moderate.  He explained that he because a grandfather three days ago, that he is experiencing financial hardship; no job. That about 5 to 6 years ago, he attempted suicide by hanging and that attempt was aborted by a then girlfriend.  Regarding substance use, Pt indicated that he occasionally use cocaine and marijuana and last use of both substance was three days ago.  When asked about incident leading to his admission, Pt states he could only remember putting a cord around his neck but he did not intend to kill or harm himself.  Orthostatic VSS; EKG obtained; labs drawn.  At this time, Pt is lying comfortably in bed and appeared to be asleep.   10/10/24 2300  Psych Admission Type (Psych Patients Only)  Admission Status Involuntary  Psychosocial Assessment  Patient Complaints Anxiety  Eye Contact Poor  Facial Expression Flat  Affect Flat  Speech Logical/coherent  Interaction Assertive;Minimal  Motor Activity Slow;Unsteady;Lethargic  Appearance/Hygiene Unremarkable  Behavior Characteristics Cooperative  Mood Anxious  Thought Process  Coherency WDL  Content WDL  Delusions None reported or observed  Perception WDL  Hallucination None reported or observed  Judgment Impaired  Confusion Mild  Danger to Self  Current suicidal ideation? Denies  Danger to Others  Danger to Others None reported or observed

## 2024-10-10 NOTE — ED Notes (Signed)
 Called to request sheriff transport to [facility].

## 2024-10-10 NOTE — ED Triage Notes (Signed)
 Pt to ED via [facility] from home. Per pt's girlfriend, pt attempted to hang himself with an electrical cord. As soon as pt stepped off of stool, cord broke. EMS reports pt moving all extremities, GCS = 15 since their arrival. Incident occurred approximately 30 minutes prior to arrival. Pt has some redness to neck. Pt's girlfriend state pt was trying to hurt himself, pt denies on arrival.

## 2024-10-10 NOTE — Consult Note (Signed)
 Initial Consultation Note   Patient: Benjamin Shields FMW:[id number] DOB: 10/10/1971 PCP: Benjamin Beverley NOVAK, MD DOA: 10/10/2024 DOS: the patient was seen and examined on 10/10/2024 Primary service: Benjamin Mellow, MD  Referring physician: Mellow Sequin, MD Reason for consult: lethargy and somnolence  Assessment/Plan: Assessment and Plan: No notes have been filed under this hospital service. Service: Hospitalist  Somnolence, possibly medication related Substance use disorder Possible toxic metabolic encephalopathy Increased lethargy probably related to amphetamine/cocaine on urine toxicology screen in combination with Benadryl /Reglan a few hours prior.  CT head and neck were reassuring - Will recommend neurologic checks and fall precautions - Continue close monitoring on the behavioral health unit - Avoid sedating meds if possible while on BHU -Will follow-up CK , TSH, troponin-->CK 103, troponin 3, TSH WNL -Noted slight abnormality on EKG similar to prior EKGs-->repeat EKG, NSR at 88    TRH will sign off at present, please call us  again when needed.  HPI: Benjamin Shields is a 53 y.o. 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  He reportedly needed two-person assist for ambulation.   Review of workup in the ED was mostly notable for positive urine toxicology with presence of cocaine, cannabinoids and urine.   Vitals were normal . Orthostatic vitas signs negative. Alcohol and salicylate levels were undetectable..  Creatinine mildly elevated at 1.32 with otherwise normal CMP and CBC completely within normal limits. CT angio head and neck showed no evidence of trauma EKG showed sinus at 88 and an early repolarization pattern similar to prior EKG from 09/01/24 Of note, patient was premedicated  with Benadryl 25 mg p.o. and Reglan 10 mg p.o. at 1424, prior to his CT. On speaking to consultant physician, Benjamin Shields she is in the process of ordering a CK to evaluate for rhabdo and she has concerns about ST changes seen on the EKG done previously.  Review of Systems: As mentioned in the history of present illness. All other systems reviewed and are negative. Past Medical History:  Diagnosis Date   Asthma    Diabetes mellitus without complication (HCC)    High cholesterol    Hypertension    Pericarditis    Thyroid disease    Past Surgical History:  Procedure Laterality Date   VARICOSE VEIN SURGERY     Social History:  reports that he has been smoking cigarettes. He has never used smokeless tobacco. He reports current alcohol use. He reports current drug use. Drugs: Marijuana, Amphetamines, and Cocaine.  Allergies  Allergen Reactions   Banana    Cat Dander Swelling   Penicillins Hives    Pt states he can take amoxicillin  without complication    History reviewed. No pertinent family history.  Prior to Admission medications   Medication Sig Start Date End Date Taking? Authorizing Provider  albuterol  (VENTOLIN  HFA) 108 (90 Base) MCG/ACT inhaler Inhale 2 puffs into the lungs every 6 (six) hours as needed for wheezing or shortness of breath. 02/02/24  Yes Benjamin Beverley NOVAK, MD  cyclobenzaprine  (FLEXERIL ) 10 MG tablet Take 1 tablet (10 mg total) by mouth 2 (two) times daily as needed for muscle spasms. 06/03/24   Silver Wonda LABOR, PA  hydrOXYzine  (ATARAX ) 10 MG tablet Take 2.5 tablets (25 mg total) by mouth 3 (three) times daily. 03/23/24   Tex Drilling, NP  meloxicam  (  MOBIC ) 15 MG tablet TAKE 1 TABLET(15 MG) BY MOUTH DAILY Patient taking differently: Take 15 mg by mouth daily as needed for pain. 07/14/24   Benjamin Beverley NOVAK, MD  OLANZapine  (ZYPREXA ) 5 MG tablet Take 1 tablet (5 mg total) by mouth at bedtime. Patient not taking: Reported on 10/10/2024 03/23/24 03/23/25  Tex Drilling, NP  RISPERIDONE PO Take 1 tablet by mouth at bedtime.    [provider]    Physical Exam: Vitals:   10/10/24 1800 10/10/24 2005  BP: 103/60 (!) 116/54  Pulse: 68 64  Resp: 16 14  Temp: 98.7 F (37.1 C) 98.9 F (37.2 C)  TempSrc: Oral Oral  SpO2: 100% 100%  Weight: 62.6 kg   Height: 5' 10 (1.778 m)    Physical Exam    Cardiac Panel (last 3 results) Recent Labs    10/10/24 2003  CKTOTAL 108  TROPONINIHS 3    Data Reviewed:   Relevant notes from primary care and specialist visits, past discharge summaries as available in EHR, including Care Everywhere. Prior diagnostic testing as pertinent to current admission diagnoses Updated medications and problem lists for reconciliation ED course, including vitals, labs, imaging, treatment and response to treatment Triage notes, nursing and pharmacy notes and ED provider's notes Notable results as noted in HPI   Family Communication: none Primary team communication: Benjamin Shields Thank you very much for involving us  in the care of your patient.  Author: Delayne LULLA Solian, MD 10/10/2024 8:07 PM  For on call review [email or web address].

## 2024-10-10 NOTE — Tx Team (Signed)
 Initial Treatment Plan 10/10/2024 6:59 PM [name] FMW:[id number]    PATIENT STRESSORS: Marital or family conflict     PATIENT STRENGTHS: Capable of independent living  Financial means  Physical Health  Supportive family/friends    PATIENT IDENTIFIED PROBLEMS:                      DISCHARGE CRITERIA:  Ability to meet basic life and health needs Improved stabilization in mood, thinking, and/or behavior Need for constant or close observation no longer present Verbal commitment to aftercare and medication compliance  PRELIMINARY DISCHARGE PLAN: Outpatient therapy Return to previous living arrangement  PATIENT/FAMILY INVOLVEMENT: This treatment plan has been presented to and reviewed with the patient, Benjamin Shields.  The patient has been given the opportunity to ask questions and make suggestions.  Leita MARLA Rosella, RN 10/10/2024, 6:59 PM

## 2024-10-10 NOTE — ED Notes (Signed)
 Pt to CT scan.

## 2024-10-10 NOTE — ED Provider Notes (Signed)
 Complaining of nausea and a mild headache.  No neurologic symptoms.  No neurologic deficits on exam.  Head imaging from yesterday was reassuring.  Suspect it may be from the illicit substances in his system.  Will give him Reglan and Benadryl to treat his headache and nausea.   [name], MD 10/10/24 1420

## 2024-10-10 NOTE — Group Note (Signed)
 Date:  10/10/2024 Time:  9:09 PM  Group Topic/Focus:  Recovery Goals:   The focus of this group is to identify appropriate goals for recovery and establish a plan to achieve them.    Participation Level:  Did Not Attend  Participation Quality:  none  Affect:  none  Cognitive:  none  Insight: None  Engagement in Group:  none  Modes of Intervention:  none  Additional Comments:  none   Detavious Rinn 10/10/2024, 9:09 PM

## 2024-10-10 NOTE — Consult Note (Signed)
 Unable to assess patient at this time due to excessive sleepiness and decreased level of arousal.  Patient will be reevaluated when arousal improves.

## 2024-10-10 NOTE — ED Provider Notes (Signed)
 Emergency Department Provider Note   I have reviewed the triage vital signs and the nursing notes.   HISTORY  Chief Complaint Hanging   HPI Benjamin Shields is a 53 y.o. male with past history reviewed below presents to the emergency department after brief episode of hanging.  According to EMS and his girlfriend on scene he had some electrical cord wrapped around his neck.  She saw him step off of a stool at which point the cord got tight but ultimately snapped and he fell.  Patient tells me that this was not an attempt to harm himself and that he just wants to talk to his girlfriend.  He states he had the cord around his neck to hold it there.  Denies any drugs or alcohol use.    Past Medical History:  Diagnosis Date   Asthma    Diabetes mellitus without complication (HCC)    High cholesterol    Hypertension    Pericarditis    Thyroid disease     Review of Systems  Constitutional: No fever/chills Cardiovascular: Denies chest pain. Respiratory: Denies shortness of breath. Gastrointestinal: No abdominal pain.  No nausea, no vomiting.  Skin: Negative for rash. Neurological: Negative for headaches, focal weakness or numbness. ____________________________________________   PHYSICAL EXAM:  VITAL SIGNS: ED Triage Vitals  Encounter Vitals Group     BP 10/10/24 0355 130/85     Pulse Rate 10/10/24 0354 87     Resp 10/10/24 0354 12     Temp 10/10/24 0354 98 F (36.7 C)     Temp Source 10/10/24 0354 Oral     SpO2 10/10/24 0354 100 %     Weight 10/10/24 0355 150 lb (68 kg)     Height 10/10/24 0355 5' 9 (1.753 m)    Constitutional: Alert and oriented. No distress.  Eyes: Conjunctivae are normal.  Head: Atraumatic. Nose: No congestion/rhinnorhea. Mouth/Throat: Mucous membranes are moist.  Oropharynx non-erythematous. Neck: No stridor. No anterior neck crepitus. Abrasions to the anterior neck.  Cardiovascular: Normal rate, regular rhythm. Good peripheral circulation.  Grossly normal heart sounds.   Respiratory: Normal respiratory effort.  No retractions. Lungs CTAB. Gastrointestinal: Soft and nontender. No distention.  Musculoskeletal: No gross deformities of extremities. Neurologic:  Normal speech and language. No gross focal neurologic deficits are appreciated.  Skin:  Skin is warm, dry and intact.   ____________________________________________   LABS (all labs ordered are listed, but only abnormal results are displayed)  Labs Reviewed  COMPREHENSIVE METABOLIC PANEL WITH GFR - Abnormal; Notable for the following components:      Result Value   Glucose, Bld 118 (*)    Creatinine, Ser 1.32 (*)    Total Protein 5.9 (*)    All other components within normal limits  ACETAMINOPHEN  LEVEL - Abnormal; Notable for the following components:   Acetaminophen  (Tylenol ), Serum <10 (*)    All other components within normal limits  SALICYLATE LEVEL - Abnormal; Notable for the following components:   Salicylate Lvl <7.0 (*)    All other components within normal limits  RAPID URINE DRUG SCREEN, HOSP PERFORMED - Abnormal; Notable for the following components:   Cocaine POSITIVE (*)    Amphetamines POSITIVE (*)    Tetrahydrocannabinol POSITIVE (*)    All other components within normal limits  ETHANOL  LIPASE, BLOOD  CBC WITH DIFFERENTIAL/PLATELET   ____________________________________________  EKG   EKG Interpretation Date/Time:  Monday October 10 2024 03:55:28 EDT Ventricular Rate:  88 PR Interval:  167 QRS  Duration:  83 QT Interval:  368 QTC Calculation: 446 R Axis:   91  Text Interpretation: Sinus rhythm Right atrial enlargement Borderline right axis deviation ST elev, probable normal early repol pattern Confirmed by Darra Chew [phone]) on 10/10/2024 4:06:53 AM        ____________________________________________  RADIOLOGY  CT ANGIO HEAD NECK W WO CM Result Date: 10/10/2024 EXAM: CTA Head and Neck with Intravenous Contrast. CT Head  without Contrast. CLINICAL HISTORY: 53 year old male. Neck trauma, dangerous injury mechanism (Age 43-64y); hanging. Attempted hanging. TECHNIQUE: Axial CTA images of the head and neck performed without and with intravenous contrast (75 mL iohexol 350 MG/ML injection). MIP reconstructed images were created and reviewed. The CTA portion was initially motion degraded at the carotid bifurcation level and was repeated with good result at 05:39 AM today. Axial computed tomography images of the head/brain performed without intravenous contrast. Note: Per PQRS, the description of internal carotid artery percent stenosis, including 0 percent or normal exam, is based on [place] American Symptomatic Carotid Endarterectomy Trial (NASCET) criteria. Dose reduction technique was used including one or more of the following: automated exposure control, adjustment of mA and kV according to patient size, and/or iterative reconstruction. CONTRAST: Without and with; COMPARISON: CT head 09/04/2023. Cervical spine CT 07/12/2016. FINDINGS: CT HEAD: BRAIN: Motion artifact at the skull base. No acute intraparenchymal hemorrhage. No mass lesion. No CT evidence for acute territorial infarct. No midline shift or extra-axial collection. Brain volume is stable, with minimal limits. Cavum septum pellucidum, normal variant. Grossly stable and normal gray-white differentiation bilaterally. VENTRICLES: No hydrocephalus. ORBITS: The orbits are unremarkable. SINUSES AND MASTOIDS: Mild paranasal sinus mucosal thickening appears stable and inconsequential. The mastoid air cells are clear. CTA NECK: COMMON CAROTID ARTERIES: Dense right subclavian venous contrast streak artifact on the repeat images (and both sets of images) obscures the proximal right common carotid artery. The visible right common carotid artery is normal. Patent left common carotid artery without atherosclerosis. No significant stenosis, dissection, or occlusion of the visible portions.  INTERNAL CAROTID ARTERIES: Right carotid bifurcation with interval limits. Mildly tortuous but otherwise normal right internal carotid artery. Normal right posterior communicating artery origin. Patent left internal carotid artery also without atherosclerosis. Normal left posterior communicating artery origin. No stenosis by NASCET criteria. No dissection or occlusion. VERTEBRAL ARTERIES: Dense venous contrast obscures the right vertebral artery V1 segment. The proximal right subclavian artery and right vertebral artery origin appear normal. Proximal left subclavian artery and left vertebral artery origin are normal. Codominant vertebral arteries are patent to the skull base with no convincing injury, atherosclerosis, or stenosis. Patent PICA origins and vertebrobasilar junction. No significant stenosis, dissection, or occlusion. CTA HEAD: ANTERIOR CEREBRAL ARTERIES: Normal ACA origins. Left ACA A1 is dominant. Normal anterior communicating artery. Normal ACA branches. No significant stenosis, occlusion, or aneurysm. MIDDLE CEREBRAL ARTERIES: Normal MCA origins. Normal MCA branches. No significant stenosis, occlusion, or aneurysm. POSTERIOR CEREBRAL ARTERIES: Fetal type bilateral PCA origins. Normal SCA and P1 segments. Bilateral PCA branches are within normal limits. No significant stenosis, occlusion, or aneurysm. BASILAR ARTERY: Normal basilar artery origin. Patent basilar artery without stenosis. No significant stenosis, occlusion, or aneurysm. OTHER: Major dural venous sinuses are enhancing and appear to be patent. Bovine arch configuration, normal variant. No arch atherosclerosis. SOFT TISSUES: Thyroid, larynx, pharynx, parapharyngeal spaces, retropharyngeal space, sublingual space, submandibular spaces, masticator and parotid spaces are within normal limits. Negative visible mediastinum. Upper lungs are well aerated with mild subpleural lung scarring. No acute  finding. No masses or lymphadenopathy. BONES:  Cervical spine disc and endplate degeneration C3-C4 and C5-C6 has progressed since 2017. Chronic straightening of cervical lordosis. Maintained cervical vertebral height and alignment. C1 and C2 appear intact and aligned. No acute osseous abnormality. IMPRESSION: 1. No acute traumatic injury identified. 2. No stenosis, aneurysm, or dissection of the head and neck arteries. 3. Normal for age non-contrast CT appearance of the brain. Electronically signed by: Helayne Hurst MD 10/10/2024 06:16 AM EDT RP Workstation: [id number]    ____________________________________________   PROCEDURES  Procedure(s) performed:   Procedures  None  ____________________________________________   INITIAL IMPRESSION / ASSESSMENT AND PLAN / ED COURSE  Pertinent labs & imaging results that were available during my care of the patient were reviewed by me and considered in my medical decision making (see chart for details).   This patient is Presenting for Evaluation of hanging, which does require a range of treatment options, and is a complaint that involves a high risk of morbidity and mortality.  The Differential Diagnoses include tracheal injury, neck contusion, vascular injury, anoxia, etc.  Critical Interventions-    Medications  nicotine (NICODERM CQ - dosed in mg/24 hours) patch 21 mg (has no administration in time range)  alum & mag hydroxide-simeth (MAALOX/MYLANTA) 200-200-20 MG/5ML suspension 30 mL (has no administration in time range)  ondansetron (ZOFRAN) tablet 4 mg (has no administration in time range)  acetaminophen  (TYLENOL ) tablet 650 mg (has no administration in time range)  sodium chloride  0.9 % bolus 500 mL (0 mLs Intravenous Stopped 10/10/24 0527)  iohexol (OMNIPAQUE) 350 MG/ML injection 75 mL (75 mLs Intravenous Contrast Given 10/10/24 0548)    Reassessment after intervention: patient resting. No SOB. No hypoxemia.   I did obtain Additional Historical Information from EMS.    Clinical  Laboratory Tests Ordered, included CBC without leukocytosis or anemia.  No acute kidney injury.  Tylenol  and salicylate negative.  EtOH negative.  Radiologic Tests Ordered, included CTA head/neck. I independently interpreted the images and agree with radiology interpretation.   Cardiac Monitor Tracing which shows NSR.    Social Determinants of Health Risk patient is a smoker.   Consult complete with TTS.  Medical Decision Making: Summary:  Patient presents emergency department for evaluation after a brief hanging event.  Apparently had some electrical cord around his neck and stepped off a stool which point the cord broke.  He does have some abrasion to the anterior neck.  He is in no distress.  The event was apparently witnessed by his girlfriend who is and route and he did not hang for very Kesi Perrow or lose consciousness.  No symptoms currently.  Patient denies of this was intentional but I have already indicated to him that he will need to see psychiatry. I have completed IVC paperwork and filed in the ED.   Reevaluation with update and discussion with patient and girlfriend at bedside. Patient to see psychiatry. Patient is under IVC.   Patient's presentation is most consistent with acute presentation with potential threat to life or bodily function.   Disposition: pending  ____________________________________________  FINAL CLINICAL IMPRESSION(S) / ED DIAGNOSES  Final diagnoses:  Hanging, initial encounter    Note:  This document was prepared using Dragon voice recognition software and may include unintentional dictation errors.  Fonda Law, MD, [facility] Emergency Medicine    Yves Fodor, Fonda MATSU, MD 10/10/24 [phone]

## 2024-10-10 NOTE — ED Notes (Signed)
 PD at bedside to discuss IVC w/pt.

## 2024-10-10 NOTE — Progress Notes (Signed)
 Patient arrived to the unit under IVC. On arrival, patient lethargic with an unsteady gait and complaints of dizziness. Patient required frequent name calling during intake due to lethargy. Patient unable to walk and stand without standby assist due to being unsteady on his feet. Provider notified. Patient is arousable with verbal stimuli and is able to respond logically and clear to questions. Will continue to monitor.
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
## 2024-10-10 NOTE — Progress Notes (Signed)
   10/10/24 1800  Psych Admission Type (Psych Patients Only)  Admission Status Involuntary  Psychosocial Assessment  Patient Complaints Other (Comment) (lethargy)  Eye Contact None  Facial Expression Flat  Affect Flat  Speech Logical/coherent;Soft;Slow  Interaction Minimal  Motor Activity Slow;Unsteady;Lethargic  Appearance/Hygiene Unremarkable  Behavior Characteristics Cooperative  Mood Pleasant (Patient states his goal for treatment is to get in and get out so I can be better.)  Thought Process  Coherency WDL  Content WDL  Delusions None reported or observed  Perception WDL  Hallucination None reported or observed  Judgment Poor  Confusion Mild  Danger to Self  Current suicidal ideation? Denies  Danger to Others  Danger to Others None reported or observed

## 2024-10-10 NOTE — ED Provider Notes (Signed)
 Emergency Medicine Observation Re-evaluation Note  Benjamin Shields is a 53 y.o. male, seen on rounds today.  Pt initially presented to the ED for complaints of Hanging Currently, the patient is not having acute complaints.  Physical Exam  BP 127/82 (BP Location: Right Arm)   Pulse 88   Temp 98 F (36.7 C) (Oral)   Resp 14   Ht 5' 9 (1.753 m)   Wt 68 kg   SpO2 98%   BMI 22.15 kg/m  Physical Exam General: NAD Cardiac: RRR, No MRG Lungs: CTAB  ED Course / MDM  EKG:EKG Interpretation Date/Time:  Monday October 10 2024 03:55:28 EDT Ventricular Rate:  88 PR Interval:  167 QRS Duration:  83 QT Interval:  368 QTC Calculation: 446 R Axis:   91  Text Interpretation: Sinus rhythm Right atrial enlargement Borderline right axis deviation ST elev, probable normal early repol pattern Confirmed by Darra Chew [phone]) on 10/10/2024 4:06:53 AM  I have reviewed the labs performed to date as well as medications administered while in observation.  Recent changes in the last 24 hours include medically cleared.  Still awaiting TTS evaluation.  Plan  Current plan is for TTS eval.    Benjamin Lamar BROCKS, MD 10/10/24 [phone]

## 2024-10-10 NOTE — ED Notes (Signed)
 Pt C/O headache. Notified MD, who came to see patient. See new orders.

## 2024-10-11 DIAGNOSIS — F322 Major depressive disorder, single episode, severe without psychotic features: Principal | ICD-10-CM

## 2024-10-11 MED ORDER — RISPERIDONE 1 MG PO TABS: 0.5000 mg | ORAL_TABLET | Freq: Two times a day (BID) | ORAL | Status: DC

## 2024-10-11 MED ORDER — ALBUTEROL SULFATE HFA 108 (90 BASE) MCG/ACT IN AERS: 2.0000 | INHALATION_SPRAY | Freq: Four times a day (QID) | RESPIRATORY_TRACT | Status: DC | PRN
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
## 2024-10-11 NOTE — [facility] Suicide Risk Assessment (Signed)
 [facility] INPATIENT:  Family/Significant Other Suicide Prevention Education  Suicide Prevention Education:  Education Completed; Benjamin Shields, [phone], Girlfriend , has been identified by the patient as the family member/significant other with whom the patient will be residing, and identified as the person(s) who will aid the patient in the event of a mental health crisis (suicidal ideations/suicide attempt).  With written consent from the patient, the family member/significant other has been provided the following suicide prevention education, prior to the and/or following the discharge of the patient.  The suicide prevention education provided includes the following: Suicide risk factors Suicide prevention and interventions National Suicide Hotline telephone number [facility] assessment telephone number [facility] Emergency Assistance 911 [facility] and/or Residential Mobile Crisis Unit telephone number  Request made of family/significant other to: Remove weapons (e.g., guns, rifles, knives), all items previously/currently identified as safety concern.   Remove drugs/medications (over-the-counter, prescriptions, illicit drugs), all items previously/currently identified as a safety concern.  The family member/significant other verbalizes understanding of the suicide prevention education information provided.  The family member/significant other agrees to remove the items of safety concern listed above.  According to patient's partner, he recently "had an episode." Partner believes it was due "to him not taking his medication." Partner reported that she does not believe the patient to be a danger to himself or others and reported no weapons in the home. Partner reported that the patient can return to the home at discharge and that she will provide transportation. Partner reported no present safety concerns.   Benjamin Shields 10/11/2024, 2:49 PM

## 2024-10-11 NOTE — Consult Note (Addendum)
 Initial Consultation Note   Patient: Benjamin Shields FMW:[id number] DOB: 1971/02/28 PCP: Sebastian Beverley NOVAK, MD DOA: 10/10/2024 DOS: the patient was seen and examined on 10/11/2024 Primary service: Donnelly Mellow, MD  Referring physician: Mellow Sequin, MD Reason for consult: lethargy and somnolence  Assessment/Plan: Assessment and Plan:   Lethargy Lightheadedness Labs unremarkable, no signs of infection, EKG unremarkable, CTA of head and neck nothing acute, neuro exam non-focal, benign exam, tolerating diet, reports interval improvement in symptoms from yesterday. Presented with several toxic substances on UDS (cocaine, amphetamine, thc), so possibly recovering from them. Workup here has been adequate, suspect patient will continue to improve in the coming days. If new or worsening symptoms would re-consult. Would avoid sedating meds if possible. Currently medically stable for discharge  [facility] hospital[facility] will sign off.  Subjective:  Reports some mild lightheadedness when walking. No dyspnea, no chest or other pain, tolerating diet, no n/v/d, no palpitations    Past Medical History:  Diagnosis Date   Asthma    Diabetes mellitus without complication (HCC)    High cholesterol    Hypertension    Pericarditis    Thyroid disease    Past Surgical History:  Procedure Laterality Date   VARICOSE VEIN SURGERY     Social History:  reports that he has been smoking cigarettes. He has never used smokeless tobacco. He reports current alcohol use. He reports current drug use. Drugs: Marijuana, Amphetamines, and Cocaine.  Allergies  Allergen Reactions   Banana    Cat Dander Swelling   Penicillins Hives    Pt states he can take amoxicillin  without complication    History reviewed. No pertinent family history.  Prior to Admission medications   Medication Sig Start Date End Date Taking? Authorizing Provider  albuterol  (VENTOLIN  HFA) 108 (90 Base) MCG/ACT inhaler Inhale 2 puffs into  the lungs every 6 (six) hours as needed for wheezing or shortness of breath. 02/02/24  Yes Sebastian Beverley NOVAK, MD  cyclobenzaprine  (FLEXERIL ) 10 MG tablet Take 1 tablet (10 mg total) by mouth 2 (two) times daily as needed for muscle spasms. 06/03/24   Silver Wonda LABOR, PA  hydrOXYzine  (ATARAX ) 10 MG tablet Take 2.5 tablets (25 mg total) by mouth 3 (three) times daily. 03/23/24   Tex Drilling, NP  meloxicam  (MOBIC ) 15 MG tablet TAKE 1 TABLET(15 MG) BY MOUTH DAILY Patient taking differently: Take 15 mg by mouth daily as needed for pain. 07/14/24   Sebastian Beverley NOVAK, MD  OLANZapine  (ZYPREXA ) 5 MG tablet Take 1 tablet (5 mg total) by mouth at bedtime. Patient not taking: Reported on 10/10/2024 03/23/24 03/23/25  Tex Drilling, NP  RISPERIDONE PO Take 1 tablet by mouth at bedtime.    [provider]    Physical Exam: Vitals:   10/10/24 2005 10/10/24 2008 10/10/24 2012 10/11/24 0609  BP: (!) 116/54 124/69 124/81 (!) 105/58  Pulse: 64 72 84 68  Resp: 14   17  Temp: 98.9 F (37.2 C)   98 F (36.7 C)  TempSrc: Oral     SpO2: 100% 100% 100% 98%  Weight:      Height:       Exam NAD, appears depressed CTAB RRR no murmur Abdomen soft, non-tender Cn 2-12 grossly intact, 5/5 upper and lower strength, distal sensation intact, ambulates without assistance Extremities warm, no edema    Cardiac Panel (last 3 results) Recent Labs    10/10/24 2003 10/10/24 2135  CKTOTAL 108  --   TROPONINIHS 3 3  Data Reviewed:   Relevant notes from primary care and specialist visits, past discharge summaries as available in EHR, including Care Everywhere. Prior diagnostic testing as pertinent to current admission diagnoses Updated medications and problem lists for reconciliation ED course, including vitals, labs, imaging, treatment and response to treatment Triage notes, nursing and pharmacy notes and ED provider's notes Notable results as noted in HPI   Family Communication: none Thank you  very much for involving us  in the care of your patient.  Author: Devaughn KATHEE Ban, MD 10/11/2024 3:30 PM  For on call review [email or web address].
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
## 2024-10-11 NOTE — Group Note (Signed)
 Date:  10/11/2024 Time:  9:12 PM  Group Topic/Focus:  Wrap-Up Group:   The focus of this group is to help patients review their daily goal of treatment and discuss progress on daily workbooks.    Participation Level:  Active  Participation Quality:  Appropriate and Attentive  Affect:  Anxious and Appropriate  Cognitive:  Alert and Appropriate  Insight: Appropriate and Good  Engagement in Group:  Distracting  Modes of Intervention:  Orientation  Additional Comments:     Benjamin Shields 10/11/2024, 9:12 PM

## 2024-10-11 NOTE — [facility] Suicide Risk Assessment (Signed)
 [facility] Admission Suicide Risk Assessment   Nursing information obtained from:  Patient Demographic factors:  Male, Unemployed Current Mental Status:  NA Loss Factors:  NA Historical Factors:  Prior suicide attempts, Family history of mental illness or substance abuse, Impulsivity Risk Reduction Factors:  Living with another person, especially a relative, Positive social support  Total Time spent with patient: 30 minutes Principal Problem: MDD (major depressive disorder), severe (HCC) Diagnosis:  Principal Problem:   MDD (major depressive disorder), severe (HCC) Active Problems:   Suicide attempt (HCC)   Hanging, initial encounter   Lethargy   Somnolence- suspect medication related/Acute toxic encephalopathy  Subjective Data: This is a 53 year old male with a history of depression and anxiety who presented to the ED after possible suicide attempt via hanging.  Presenting symptoms include worsening depression.  Home psychiatric medications include risperidone. Family history is significant for depression and anxiety.  Patient is admitted to the adult inpatient unit with every 15-minute safety monitoring.  Multidisciplinary team approach is offered.  Medication management, group/milieu therapy is offered.  Continued Clinical Symptoms:  Alcohol Use Disorder Identification Test Final Score (AUDIT): 3 The Alcohol Use Disorders Identification Test, Guidelines for Use in Primary Care, Second Edition.  World Science writer [facility]). Score between 0-7:  no or low risk or alcohol related problems. Score between 8-15:  moderate risk of alcohol related problems. Score between 16-19:  high risk of alcohol related problems. Score 20 or above:  warrants further diagnostic evaluation for alcohol dependence and treatment.   CLINICAL FACTORS:   Depression:   Hopelessness   Musculoskeletal: Strength & Muscle Tone: decreased Gait & Station: unsteady Patient leans: N/A  Psychiatric Specialty  Exam:  Presentation  General Appearance:  Appropriate for Environment  Eye Contact: Minimal  Speech: Slow  Speech Volume: Decreased  Handedness: Right   Mood and Affect  Mood: Depressed  Affect: Congruent   Thought Process  Thought Processes: Coherent; Linear  Descriptions of Associations:Intact  Orientation:Full (Time, Place and Person)  Thought Content:WDL  History of Schizophrenia/Schizoaffective disorder:No  Duration of Psychotic Symptoms:Less than six months  Hallucinations:Hallucinations: None  Ideas of Reference:None  Suicidal Thoughts:Suicidal Thoughts: Yes, Active SI Active Intent and/or Plan: With Plan; With Intent  Homicidal Thoughts:Homicidal Thoughts: No   Sensorium  Memory: Immediate Fair; Remote Fair  Judgment: Poor  Insight: Poor   Executive Functions  Concentration: Fair  Attention Span: Fair  Recall: [employer] of Knowledge: Fair  Language: Fair   Psychomotor Activity  Psychomotor Activity: Psychomotor Activity: Decreased   Assets  Assets: Social Support; Housing   Sleep  Sleep: Sleep: Fair    Physical Exam: Physical Exam ROS Blood pressure 106/60, pulse 64, temperature 98 F (36.7 C), resp. rate 17, height 5' 10 (1.778 m), weight 62.6 kg, SpO2 100%. Body mass index is 19.8 kg/m.   COGNITIVE FEATURES THAT CONTRIBUTE TO RISK:  None    SUICIDE RISK:   Severe:  Frequent, intense, and enduring suicidal ideation, specific plan, no subjective intent, but some objective markers of intent (i.e., choice of lethal method), the method is accessible, some limited preparatory behavior, evidence of impaired self-control, severe dysphoria/symptomatology, multiple risk factors present, and few if any protective factors, particularly a lack of social support.  PLAN OF CARE: Patient is admitted to the adult inpatient unit with every 15-minute safety monitoring.  Multidisciplinary team approach is offered.   Medication management, group/milieu therapy is offered.  I certify that inpatient services furnished can reasonably be expected to improve the  patient's condition.   Camelia LITTIE Lukes, PA-C 10/11/2024, 7:14 PM

## 2024-10-11 NOTE — Group Note (Signed)
 Date:  10/11/2024 Time:  11:08 AM  Group Topic/Focus:  Goals Group:   The focus of this group is to help patients establish daily goals to achieve during treatment and discuss how the patient can incorporate goal setting into their daily lives to aide in recovery.    Participation Level:  Did Not Attend   Benjamin Shields 10/11/2024, 11:08 AM

## 2024-10-11 NOTE — Plan of Care (Signed)

## 2024-10-11 NOTE — Group Note (Signed)
 Recreation Therapy Group Note   Group Topic:Health and Wellness  Group Date: 10/11/2024 Start Time: 1000 End Time: 1055 Facilitators: Celestia Jeoffrey BRAVO, LRT, CTRS Location: Courtyard  Group Description: [employer]. LRT and patients played games of basketball, drew with chalk, and played corn hole while outside in the courtyard while getting fresh air and sunlight. Music was being played in the background. LRT and peers conversed about different games they have played before, what they do in their free time and anything else that is on their minds. LRT encouraged pts to drink water after being outside, sweating and getting their heart rate up.  Goal Area(s) Addressed: Patient will build on frustration tolerance skills. Patients will partake in a competitive play game with peers. Patients will gain knowledge of new leisure interest/hobby.    Affect/Mood: N/A   Participation Level: Did not attend    Clinical Observations/Individualized Feedback: Patient did not attend group.   Plan: Continue to engage patient in RT group sessions 2-3x/week.   Jeoffrey BRAVO Celestia, LRT, CTRS 10/11/2024 11:44 AM

## 2024-10-11 NOTE — Progress Notes (Signed)
 Pt calm and pleasant during assessment denying SI/HI/AVH. Pt observed interacting appropriately with staff and peers on the unit. Pt didn't have any medications scheduled and hasn't requested anything PRN as of now. Pt given education, support, and encouragement to be active in his treatment plan. Pt being monitored Q 15 minutes for safety per unit protocol, remains safe on the unit

## 2024-10-11 NOTE — Group Note (Signed)
 [facility] LCSW Group Therapy Note   Group Date: 10/11/2024 Start Time: 1300 End Time: 1400  Type of Therapy/Topic:  Group Therapy:  Feelings about Diagnosis  Participation Level:  Did Not Attend    Description of Group:    This group will allow patients to explore their thoughts and feelings about diagnoses they have received. Patients will be guided to explore their level of understanding and acceptance of these diagnoses. Facilitator will encourage patients to process their thoughts and feelings about the reactions of others to their diagnosis, and will guide patients in identifying ways to discuss their diagnosis with significant others in their lives. This group will be process-oriented, with patients participating in exploration of their own experiences as well as giving and receiving support and challenge from other group members.   Therapeutic Goals: 1. Patient will demonstrate understanding of diagnosis as evidence by identifying two or more symptoms of the disorder:  2. Patient will be able to express two feelings regarding the diagnosis 3. Patient will demonstrate ability to communicate their needs through discussion and/or role plays  Summary of Patient Progress: Patient did not attend group.    Therapeutic Modalities:   Cognitive Behavioral Therapy Brief Therapy Feelings Identification    Nadara JONELLE Fam, LCSW

## 2024-10-11 NOTE — Progress Notes (Signed)
   10/11/24 1100  Psych Admission Type (Psych Patients Only)  Admission Status Involuntary  Psychosocial Assessment  Patient Complaints Anhedonia  Eye Contact Fair  Facial Expression Sad  Affect Sad  Speech Logical/coherent  Interaction Assertive;Minimal  Motor Activity Slow  Appearance/Hygiene Unremarkable  Behavior Characteristics Cooperative  Mood Depressed  Thought Process  Coherency WDL  Content WDL  Delusions None reported or observed  Perception WDL  Hallucination None reported or observed  Judgment Impaired  Confusion WDL  Danger to Self  Current suicidal ideation? Denies  Danger to Others  Danger to Others None reported or observed

## 2024-10-12 MED ORDER — RISPERIDONE 1 MG PO TABS: 0.5000 mg | ORAL_TABLET | Freq: Every day | ORAL | Status: DC

## 2024-10-12 NOTE — Plan of Care (Signed)
   Problem: Education: Goal: Emotional status will improve Outcome: Progressing Goal: Mental status will improve Outcome: Progressing

## 2024-10-12 NOTE — Plan of Care (Signed)

## 2024-10-12 NOTE — Progress Notes (Signed)
   10/12/24 1945  Spiritual Encounters  Type of Visit Initial  Care provided to: Patient  Reason for visit Routine spiritual support  OnCall Visit Yes   Chaplain was on the Unit and patient engaged Chaplain to inquire about the evening Group Therapy session that the staff has.  Patient asked if he needed to go but seemed interested in going.  Chaplain encouraged patient to see if he would find the Group session beneficial and to give it a chance.  Patient thought this sounded like a reasonable idea.  Rev. Rana M. Nicholaus, M.Div. Chaplain Resident [facility]

## 2024-10-12 NOTE — Group Note (Signed)
 Date:  10/12/2024 Time:  8:49 PM  Group Topic/Focus:  Wrap-Up Group:   The focus of this group is to help patients review their daily goal of treatment and discuss progress on daily workbooks.    Participation Level:  Active  Participation Quality:  Appropriate and Attentive  Affect:  Appropriate  Cognitive:  Alert and Appropriate  Insight: Appropriate and Good  Engagement in Group:  Improving  Modes of Intervention:  Support  Additional Comments:     Arlester CHRISTELLA Servant 10/12/2024, 8:49 PM

## 2024-10-12 NOTE — Group Note (Signed)
 Date:  10/12/2024 Time:  5:20 PM  Group Topic/Focus:  Activity Group:  The focus of the group is to promote activity for the patients and encourage them to go outside to the courtyard and get some fresh air and some exercise.    Participation Level:  Active  Participation Quality:  Appropriate  Affect:  Appropriate  Cognitive:  Appropriate  Insight: Appropriate  Engagement in Group:  Engaged  Modes of Intervention:  Activity  Additional Comments:    Benjamin Shields 10/12/2024, 5:20 PM

## 2024-10-12 NOTE — Progress Notes (Signed)
   10/12/24 1500  Psych Admission Type (Psych Patients Only)  Admission Status Involuntary  Psychosocial Assessment  Patient Complaints None  Eye Contact Fair  Facial Expression Sullen  Affect Sad  Speech Soft;Logical/coherent  Interaction Minimal  Motor Activity Slow  Appearance/Hygiene Unremarkable  Behavior Characteristics Cooperative;Appropriate to situation  Mood Pleasant  Aggressive Behavior  Effect No apparent injury  Thought Process  Coherency WDL  Content WDL  Delusions None reported or observed  Perception WDL  Hallucination None reported or observed  Judgment WDL  Confusion None  Danger to Self  Current suicidal ideation? Denies  Danger to Others  Danger to Others None reported or observed   Patient's goal for today, per his self-inventory is getting home, in which he will do whatever it takes in order to achieve his goal.

## 2024-10-12 NOTE — Group Note (Signed)
 LCSW Group Therapy Note  Group Date: 10/12/2024 Start Time: 1300 End Time: 1400   Type of Therapy and Topic:  Group Therapy - Healthy vs Unhealthy Coping Skills  Participation Level:  Did Not Attend   Description of Group The focus of this group was to determine what unhealthy coping techniques typically are used by group members and what healthy coping techniques would be helpful in coping with various problems. Patients were guided in becoming aware of the differences between healthy and unhealthy coping techniques. Patients were asked to identify 2-3 healthy coping skills they would like to learn to use more effectively.  Therapeutic Goals Patients learned that coping is what human beings do all day long to deal with various situations in their lives Patients defined and discussed healthy vs unhealthy coping techniques Patients identified their preferred coping techniques and identified whether these were healthy or unhealthy Patients determined 2-3 healthy coping skills they would like to become more familiar with and use more often. Patients provided support and ideas to each other   Summary of Patient Progress:  Patient did not attend.    Therapeutic Modalities Cognitive Behavioral Therapy Motivational Interviewing  Benjamin Shields Benjamin Shields 10/12/2024  2:31 PM

## 2024-10-12 NOTE — Progress Notes (Signed)
 [facility] MD Progress Note  10/12/2024 8:50 PM Pasco Marchitto  MRN:  [id number]   Subjective:  Chart reviewed, case discussed in multidisciplinary meeting, patient seen during rounds.   On interview today, patient is found lying in bed.  He is noted to have a flat affect.  He continues to show poor insight into his mental health condition.  He denies current depressive symptoms or anxiety.  He denies SI/HI/plan and denies hallucinations.  He denies incident involving electrical cord wrapped around neck was a suicide attempt.  He is unable to state why the cord was wrapped around his neck.  Per ED documentation, patient's girlfriend endorsed witnessing patient stepping off stool with cord wrapped around his neck, after which point the electric cord broke and patient fell to floor.  Patient has requested discharge, he is under IVC, provider discussed with patient need for ongoing safety monitoring and evaluation of psychiatric needs, patient verbalized understanding.   Sleep: Good  Appetite:  Fair  Past Psychiatric History: see h&P Family History: History reviewed. No pertinent family history. Social History:  Social History   Substance and Sexual Activity  Alcohol Use Yes   Comment: rarely     Social History   Substance and Sexual Activity  Drug Use Yes   Types: Marijuana, Amphetamines, Cocaine    Social History   Socioeconomic History   Marital status: Single    Spouse name: Not on file   Number of children: Not on file   Years of education: Not on file   Highest education level: Not on file  Occupational History   Not on file  Tobacco Use   Smoking status: Some Days    Types: Cigarettes   Smokeless tobacco: Never  Vaping Use   Vaping status: Some Days  Substance and Sexual Activity   Alcohol use: Yes    Comment: rarely   Drug use: Yes    Types: Marijuana, Amphetamines, Cocaine   Sexual activity: Not on file  Other Topics Concern   Not on file  Social History Narrative    Not on file   Social Drivers of Health   Financial Resource Strain: Not on file  Food Insecurity: No Food Insecurity (10/10/2024)   Hunger Vital Sign    Worried About Running Out of Food in the Last Year: Never true    Ran Out of Food in the Last Year: Never true  Transportation Needs: No Transportation Needs (10/10/2024)   PRAPARE - Administrator, Civil Service (Medical): No    Lack of Transportation (Non-Medical): No  Physical Activity: Not on file  Stress: Not on file  Social Connections: Unknown (05/13/2022)   [facility]   Social Network    Social Network: Not on file   Past Medical History:  Past Medical History:  Diagnosis Date   Asthma    Diabetes mellitus without complication (HCC)    High cholesterol    Hypertension    Pericarditis    Thyroid disease     Past Surgical History:  Procedure Laterality Date   VARICOSE VEIN SURGERY      Current Medications: Current Facility-Administered Medications  Medication Dose Route Frequency Provider Last Rate Last Admin   acetaminophen  (TYLENOL ) tablet 650 mg  650 mg Oral Q6H PRN Coleman, Carolyn H, NP   650 mg at 10/12/24 0918   albuterol  (VENTOLIN  HFA) 108 (90 Base) MCG/ACT inhaler 2 puff  2 puff Inhalation Q6H PRN Delon Revelo L, PA-C  alum & mag hydroxide-simeth (MAALOX/MYLANTA) 200-200-20 MG/5ML suspension 30 mL  30 mL Oral Q4H PRN Coleman, Carolyn H, NP       haloperidol (HALDOL) tablet 5 mg  5 mg Oral TID PRN Mardy Elveria DEL, NP       And   diphenhydrAMINE (BENADRYL) capsule 50 mg  50 mg Oral TID PRN Mardy Elveria DEL, NP       haloperidol lactate (HALDOL) injection 5 mg  5 mg Intramuscular TID PRN Coleman, Carolyn H, NP       And   diphenhydrAMINE (BENADRYL) injection 50 mg  50 mg Intramuscular TID PRN Coleman, Carolyn H, NP       And   LORazepam (ATIVAN) injection 2 mg  2 mg Intramuscular TID PRN Mardy Elveria DEL, NP       haloperidol lactate (HALDOL) injection 10 mg  10  mg Intramuscular TID PRN Coleman, Carolyn H, NP       And   diphenhydrAMINE (BENADRYL) injection 50 mg  50 mg Intramuscular TID PRN Mardy Elveria DEL, NP       And   LORazepam (ATIVAN) injection 2 mg  2 mg Intramuscular TID PRN Mardy Elveria DEL, NP       magnesium hydroxide (MILK OF MAGNESIA) suspension 30 mL  30 mL Oral Daily PRN Mardy Elveria DEL, NP       nicotine (NICODERM CQ - dosed in mg/24 hours) patch 21 mg  21 mg Transdermal Daily Mardy Elveria DEL, NP       ondansetron (ZOFRAN) tablet 4 mg  4 mg Oral Q8H PRN Mardy Elveria DEL, NP        Lab Results:  Results for orders placed or performed during the hospital encounter of 10/10/24 (from the past 48 hours)  Troponin I (High Sensitivity)     Status: None   Collection Time: 10/10/24  9:35 PM  Result Value Ref Range   Troponin I (High Sensitivity) 3 <18 ng/L    Comment: (NOTE) Elevated high sensitivity troponin I (hsTnI) values and significant  changes across serial measurements may suggest ACS but many other  chronic and acute conditions are known to elevate hsTnI results.  Refer to the Links section for chest pain algorithms and additional  guidance. Performed at [facility], [address] [place], [place] [address]     Blood Alcohol level:  Lab Results  Component Value Date   [facility] <15 10/10/2024    Metabolic Disorder Labs: No results found for: HGBA1C, MPG No results found for: PROLACTIN No results found for: CHOL, TRIG, HDL, CHOLHDL, VLDL, LDLCALC  Physical Findings: AIMS:  , ,  ,  ,    CIWA:    COWS:      Psychiatric Specialty Exam:  Presentation  General Appearance:  Appropriate for Environment  Eye Contact: Minimal  Speech: Slow  Speech Volume: Decreased    Mood and Affect  Mood: Depressed  Affect: Congruent   Thought Process  Thought Processes: Coherent; Linear  Descriptions of Associations:Intact  Orientation:Full (Time, Place and  Person)  Thought Content:WDL  Hallucinations:Hallucinations: None  Ideas of Reference:None  Suicidal Thoughts:Suicidal Thoughts: Yes, Active SI Active Intent and/or Plan: With Plan; With Intent  Homicidal Thoughts:Homicidal Thoughts: No   Sensorium  Memory: Immediate Fair; Remote Fair  Judgment: Poor  Insight: Poor   Executive Functions  Concentration: Fair  Attention Span: Fair  Recall: Fair  Fund of Knowledge: Fair  Language: Fair   Psychomotor Activity  Psychomotor Activity: Psychomotor Activity:  Decreased  Musculoskeletal: Strength & Muscle Tone: within normal limits Gait & Station: normal Assets  Assets: Social Support; Housing    Physical Exam: Physical Exam ROS Blood pressure 112/79, pulse (!) 59, temperature 98.1 F (36.7 C), temperature source Oral, resp. rate 18, height 5' 10 (1.778 m), weight 62.6 kg, SpO2 100%. Body mass index is 19.8 kg/m.  Diagnosis: Principal Problem:   MDD (major depressive disorder), severe (HCC) Active Problems:   Suicide attempt (HCC)   Hanging, initial encounter   Lethargy   Somnolence- suspect medication related/Acute toxic encephalopathy   PLAN: Safety and Monitoring:  -- Voluntary admission to inpatient psychiatric unit for safety, stabilization and treatment  -- Daily contact with patient to assess and evaluate symptoms and progress in treatment  -- Patient's case to be discussed in multi-disciplinary team meeting  -- Observation Level : q15 minute checks  -- Vital signs:  q12 hours  -- Precautions: suicide, elopement, and assault -- Encouraged patient to participate in unit milieu and in scheduled group therapies   2. Psychiatric Diagnoses and Treatment:    MDD, recurrent, severe Will hold home medication risperidone at this time due to sedation and for ongoing evaluation psychiatric needs Will also hold hydroxyzine  and trazodone at this time due to sedation   -- The  risks/benefits/side-effects/alternatives to this medication were discussed in detail with the patient and time was given for questions. The patient consents to medication trial.                -- Metabolic profile and EKG monitoring obtained while on an atypical antipsychotic (BMI: Lipid Panel: HbgA1c: QTc:)              -- Encouraged patient to participate in unit milieu and in scheduled group therapies                 Hospital Course:     3. Medical Issues Being Addressed:  Evaluated by hospitalist for lethargy, improving, hospitalist has recommended avoiding sedating medications at this time   4. Discharge Planning:   -- Social work and case management to assist with discharge planning and identification of hospital follow-up needs prior to discharge  -- Estimated LOS: 3-4 days  Camelia LITTIE Lukes, PA-C 10/12/2024, 8:50 PM

## 2024-10-12 NOTE — Plan of Care (Signed)
  Problem: Education: Goal: Knowledge of [facility] Education information/materials will improve 10/12/2024 1708 by Shirley Jon FALCON, RN Outcome: Not Progressing 10/12/2024 1708 by Shirley Jon FALCON, RN Outcome: Not Progressing Goal: Emotional status will improve 10/12/2024 1708 by Shirley Jon FALCON, RN Outcome: Not Progressing 10/12/2024 1708 by Shirley Jon FALCON, RN Outcome: Not Progressing Goal: Mental status will improve 10/12/2024 1708 by Shirley Jon FALCON, RN Outcome: Not Progressing 10/12/2024 1708 by Shirley Jon FALCON, RN Outcome: Not Progressing Goal: Verbalization of understanding the information provided will improve 10/12/2024 1708 by Shirley Jon FALCON, RN Outcome: Not Progressing 10/12/2024 1708 by Shirley Jon FALCON, RN Outcome: Not Progressing   Problem: Activity: Goal: Interest or engagement in activities will improve 10/12/2024 1708 by Shirley Jon FALCON, RN Outcome: Not Progressing 10/12/2024 1708 by Shirley Jon FALCON, RN Outcome: Not Progressing Goal: Sleeping patterns will improve 10/12/2024 1708 by Shirley Jon FALCON, RN Outcome: Not Progressing 10/12/2024 1708 by Shirley Jon FALCON, RN Outcome: Not Progressing   Problem: Coping: Goal: Ability to verbalize frustrations and anger appropriately will improve 10/12/2024 1708 by Shirley Jon FALCON, RN Outcome: Not Progressing 10/12/2024 1708 by Shirley Jon FALCON, RN Outcome: Not Progressing Goal: Ability to demonstrate self-control will improve 10/12/2024 1708 by Shirley Jon FALCON, RN Outcome: Not Progressing 10/12/2024 1708 by Shirley Jon FALCON, RN Outcome: Not Progressing   Problem: Health Behavior/Discharge Planning: Goal: Identification of resources available to assist in meeting health care needs will improve 10/12/2024 1708 by Shirley Jon FALCON, RN Outcome: Not Progressing 10/12/2024 1708 by Shirley Jon FALCON, RN Outcome: Not Progressing Goal: Compliance with treatment plan for underlying  cause of condition will improve 10/12/2024 1708 by Shirley Jon FALCON, RN Outcome: Not Progressing 10/12/2024 1708 by Shirley Jon FALCON, RN Outcome: Not Progressing   Problem: Physical Regulation: Goal: Ability to maintain clinical measurements within normal limits will improve 10/12/2024 1708 by Shirley Jon FALCON, RN Outcome: Not Progressing 10/12/2024 1708 by Shirley Jon FALCON, RN Outcome: Not Progressing   Problem: Safety: Goal: Periods of time without injury will increase 10/12/2024 1708 by Shirley Jon FALCON, RN Outcome: Not Progressing 10/12/2024 1708 by Shirley Jon FALCON, RN Outcome: Not Progressing

## 2024-10-12 NOTE — BH IP Treatment Plan (Signed)
 Interdisciplinary Treatment and Diagnostic Plan Update  10/12/2024 Time of Session: 10:06AM Pier Bosher MRN: [id number]  Principal Diagnosis: MDD (major depressive disorder), severe (HCC)  Secondary Diagnoses: Principal Problem:   MDD (major depressive disorder), severe (HCC) Active Problems:   Suicide attempt (HCC)   Hanging, initial encounter   Lethargy   Somnolence- suspect medication related/Acute toxic encephalopathy   Current Medications:  Current Facility-Administered Medications  Medication Dose Route Frequency Provider Last Rate Last Admin   acetaminophen  (TYLENOL ) tablet 650 mg  650 mg Oral Q6H PRN Coleman, Carolyn H, NP   650 mg at 10/12/24 9081   albuterol  (VENTOLIN  HFA) 108 (90 Base) MCG/ACT inhaler 2 puff  2 puff Inhalation Q6H PRN Hunter, Crystal L, PA-C       alum & mag hydroxide-simeth (MAALOX/MYLANTA) 200-200-20 MG/5ML suspension 30 mL  30 mL Oral Q4H PRN Coleman, Carolyn H, NP       haloperidol (HALDOL) tablet 5 mg  5 mg Oral TID PRN Mardy Elveria DEL, NP       And   diphenhydrAMINE (BENADRYL) capsule 50 mg  50 mg Oral TID PRN Mardy Elveria DEL, NP       haloperidol lactate (HALDOL) injection 5 mg  5 mg Intramuscular TID PRN Mardy Elveria DEL, NP       And   diphenhydrAMINE (BENADRYL) injection 50 mg  50 mg Intramuscular TID PRN Coleman, Carolyn H, NP       And   LORazepam (ATIVAN) injection 2 mg  2 mg Intramuscular TID PRN Mardy Elveria DEL, NP       haloperidol lactate (HALDOL) injection 10 mg  10 mg Intramuscular TID PRN Mardy Elveria DEL, NP       And   diphenhydrAMINE (BENADRYL) injection 50 mg  50 mg Intramuscular TID PRN Coleman, Carolyn H, NP       And   LORazepam (ATIVAN) injection 2 mg  2 mg Intramuscular TID PRN Coleman, Carolyn H, NP       magnesium hydroxide (MILK OF MAGNESIA) suspension 30 mL  30 mL Oral Daily PRN Coleman, Carolyn H, NP       nicotine (NICODERM CQ - dosed in mg/24 hours) patch 21 mg  21 mg Transdermal Daily Mardy Elveria DEL, NP       ondansetron (ZOFRAN) tablet 4 mg  4 mg Oral Q8H PRN Coleman, Carolyn H, NP       PTA Medications: Medications Prior to Admission  Medication Sig Dispense Refill Last Dose/Taking   albuterol  (VENTOLIN  HFA) 108 (90 Base) MCG/ACT inhaler Inhale 2 puffs into the lungs every 6 (six) hours as needed for wheezing or shortness of breath. 8 g 0 Past Month   cyclobenzaprine  (FLEXERIL ) 10 MG tablet Take 1 tablet (10 mg total) by mouth 2 (two) times daily as needed for muscle spasms. 20 tablet 0    hydrOXYzine  (ATARAX ) 10 MG tablet Take 2.5 tablets (25 mg total) by mouth 3 (three) times daily. 20 tablet 0    meloxicam  (MOBIC ) 15 MG tablet TAKE 1 TABLET(15 MG) BY MOUTH DAILY (Patient taking differently: Take 15 mg by mouth daily as needed for pain.) 30 tablet 0    OLANZapine  (ZYPREXA ) 5 MG tablet Take 1 tablet (5 mg total) by mouth at bedtime. (Patient not taking: Reported on 10/10/2024) 3 tablet 0    RISPERIDONE PO Take 1 tablet by mouth at bedtime.       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   Physician Treatment Plan for Primary Diagnosis: MDD (major depressive disorder), severe (HCC) Long Term Goal(s): Improvement in symptoms so as ready for discharge   Short Term Goals: Ability to identify changes in lifestyle to reduce recurrence of condition will improve Ability to verbalize feelings will improve Ability to disclose and discuss suicidal ideas Ability to demonstrate self-control will improve  Medication Management: Evaluate patient's response, side effects, and tolerance of medication regimen.  Therapeutic Interventions: 1 to 1 sessions, Unit Group sessions and Medication administration.  Evaluation  of Outcomes: Not Met  Physician Treatment Plan for Secondary Diagnosis: Principal Problem:   MDD (major depressive disorder), severe (HCC) Active Problems:   Suicide attempt (HCC)   Hanging, initial encounter   Lethargy   Somnolence- suspect medication related/Acute toxic encephalopathy  Long Term Goal(s): Improvement in symptoms so as ready for discharge   Short Term Goals: Ability to identify changes in lifestyle to reduce recurrence of condition will improve Ability to verbalize feelings will improve Ability to disclose and discuss suicidal ideas Ability to demonstrate self-control will improve     Medication Management: Evaluate patient's response, side effects, and tolerance of medication regimen.  Therapeutic Interventions: 1 to 1 sessions, Unit Group sessions and Medication administration.  Evaluation of Outcomes: Not Met   RN Treatment Plan for Primary Diagnosis: MDD (major depressive disorder), severe (HCC) Long Term Goal(s): Knowledge of disease and therapeutic regimen to maintain health will improve  Short Term Goals: Ability to demonstrate self-control, Ability to participate in decision making will improve, Ability to verbalize feelings will improve, Ability to disclose and discuss suicidal ideas, Ability to identify and develop effective coping behaviors will improve, and Compliance with prescribed medications will improve  Medication Management: RN will administer medications as ordered by provider, will assess and evaluate patient's response and provide education to patient for prescribed medication. RN will report any adverse and/or side effects to prescribing provider.  Therapeutic Interventions: 1 on 1 counseling sessions, Psychoeducation, Medication administration, Evaluate responses to treatment, Monitor vital signs and CBGs as ordered, Perform/monitor CIWA, COWS, AIMS and Fall Risk screenings as ordered, Perform wound care treatments as ordered.  Evaluation of  Outcomes: Not Met   LCSW Treatment Plan for Primary Diagnosis: MDD (major depressive disorder), severe (HCC) Long Term Goal(s): Safe transition to appropriate next level of care at discharge, Engage patient in therapeutic group addressing interpersonal concerns.  Short Term Goals: Engage patient in aftercare planning with referrals and resources, Increase social support, Increase ability to appropriately verbalize feelings, Increase emotional regulation, Facilitate acceptance of mental health diagnosis and concerns, Facilitate patient progression through stages of change regarding substance use diagnoses and concerns, Identify triggers associated with mental health/substance abuse issues, and Increase skills for wellness and recovery  Therapeutic Interventions: Assess for all discharge needs, 1 to 1 time with Social worker, Explore available resources and support systems, Assess for adequacy in community support network, Educate family and significant other(s) on suicide prevention, Complete Psychosocial Assessment, Interpersonal group therapy.  Evaluation of Outcomes: Not Met   Progress in Treatment: Attending groups: No. Participating in groups: No. Taking medication as prescribed: Yes. Toleration medication: Yes. Family/Significant other contact made: Yes, individual(s) contacted:  SPE completed with the patient's girlfriend Patient understands diagnosis: Yes. Discussing patient identified problems/goals with staff: Yes. Medical problems stabilized or resolved: Yes. Denies  suicidal/homicidal ideation: Yes. Issues/concerns per patient self-inventory: No. Other: none  New problem(s) identified: No, Describe:  none  New Short Term/Long Term Goal(s): detox, elimination of symptoms of psychosis, medication management for mood stabilization; elimination of SI thoughts; development of comprehensive mental wellness/sobriety plan.   Patient Goals:  I just want to go home  Discharge Plan  or Barriers: CSW to assist with the development of appropriate discharge plans.    Reason for Continuation of Hospitalization: Anxiety Depression Medication stabilization Suicidal ideation  Estimated Length of Stay:  1-7 days  Last 3 [place] Suicide Severity Risk Score: Flowsheet Row Admission (Current) from 10/10/2024 in [facility] INPATIENT BEHAVIORAL MEDICINE Most recent reading at 10/10/2024  6:00 PM ED from 10/10/2024 in [facility] Emergency Department at [facility] Most recent reading at 10/10/2024  4:00 AM ED from 06/02/2024 in [facility] Emergency Department at [facility] Most recent reading at 06/02/2024 12:22 AM  C-SSRS RISK CATEGORY No Risk No Risk No Risk    Last PHQ 2/9 Scores:    02/02/2024    3:06 PM  Depression screen PHQ 2/9  Decreased Interest 0  Down, Depressed, Hopeless 2  PHQ - 2 Score 2  Altered sleeping 2  Tired, decreased energy 2  Change in appetite 0  Feeling bad or failure about yourself  0  Trouble concentrating 0  Moving slowly or fidgety/restless 0  Suicidal thoughts 0  PHQ-9 Score 6  Difficult doing work/chores Not difficult at all    Scribe for Treatment Team: Sherryle JINNY Margo, LCSW 10/12/2024 10:43 AM

## 2024-10-12 NOTE — Group Note (Signed)
 Date:  10/12/2024 Time:  1:11 PM  Group Topic/Focus:  Emotional Education:   The focus of this group is to discuss what feelings/emotions are, and how they are experienced.    Participation Level:  Did Not Attend   Benjamin Shields 10/12/2024, 1:11 PM

## 2024-10-13 DIAGNOSIS — R400 Somnolence: Secondary | ICD-10-CM

## 2024-10-13 DIAGNOSIS — T1491XA Suicide attempt, initial encounter: Secondary | ICD-10-CM

## 2024-10-13 DIAGNOSIS — R5383 Other fatigue: Secondary | ICD-10-CM

## 2024-10-13 DIAGNOSIS — F329 Major depressive disorder, single episode, unspecified: Secondary | ICD-10-CM

## 2024-10-13 DIAGNOSIS — T71191A Asphyxiation due to mechanical threat to breathing due to other causes, accidental, initial encounter: Secondary | ICD-10-CM

## 2024-10-13 MED ORDER — HYDROXYZINE HCL 25 MG PO TABS: 25.0000 mg | ORAL_TABLET | Freq: Three times a day (TID) | ORAL | Status: DC | PRN

## 2024-10-13 NOTE — Progress Notes (Signed)
   10/13/24 0900  Psych Admission Type (Psych Patients Only)  Admission Status Involuntary  Psychosocial Assessment  Patient Complaints Anger;Irritability (patient is upset that he is still in the hospital and is ready to go home.)  Eye Contact Fair  Facial Expression Angry  Affect Irritable  Speech Aggressive  Interaction Minimal  Motor Activity Slow  Appearance/Hygiene Unremarkable  Behavior Characteristics Cooperative;Appropriate to situation  Mood Irritable  Aggressive Behavior  Effect No apparent injury  Thought Process  Coherency WDL  Content Blaming others  Delusions None reported or observed  Perception WDL  Hallucination None reported or observed  Judgment WDL  Confusion None  Danger to Self  Current suicidal ideation? Denies  Danger to Others  Danger to Others None reported or observed   Patient's goal for today, per his self-inventory is getting home, in which he will do whatever it takes to get me home with my family.

## 2024-10-13 NOTE — Group Note (Signed)
 Recreation Therapy Group Note   Group Topic:Coping Skills  Group Date: 10/13/2024 Start Time: 1000 End Time: 1050 Facilitators: Celestia Jeoffrey BRAVO, LRT, CTRS Location: Craft Room  Group Description: Mind Map.  Patient was provided a blank template of a diagram with 32 blank boxes in a tiered system, branching from the center (similar to a bubble chart). LRT directed patients to label the middle of the diagram Coping Skills. LRT and patients then came up with 8 different coping skills as examples. Pt were directed to record their coping skills in the 2nd tier boxes closest to the center.  Patients would then share their coping skills with the group as LRT wrote them out. LRT gave a handout of 99 different coping skills at the end of group.   Goal Area(s) Addressed: Patients will be able to define "coping skills". Patient will identify new coping skills.  Patient will increase communication.   Affect/Mood: Irritable and Labile   Participation Level: Moderate    Clinical Observations/Individualized Feedback: Benjamin Shields was present in group. Pt shared that his girl is his coping skill. Pt shared with LRT that he felt like he did not belong in the hospital and needed to leave. Pt shared that the hospital is only worried about the money and that he doesn't need anything while here. LRT asked pt what brought him in and pt shared that he had a cord around my neck since there was no where else for it to go. Pt asked if LRT could let the team know that pt doesn't belong here. Pt was pleasant, however, noticeably irritable and fixated on discharge.   Plan: Continue to engage patient in RT group sessions 2-3x/week.   Jeoffrey BRAVO Celestia, LRT, CTRS 10/13/2024 12:59 PM

## 2024-10-13 NOTE — [facility] Group Notes (Signed)
 Spirituality Group   Description: Participant directed exploration of values, beliefs and meaning   Following a brief framework of chaplain's role and ground rules of group behavior, participants are invited to share concerns or questions that engage spiritual life. Emphasis placed on common themes and shared experiences and ways to make meaning and clarify living into one's values.   Theory/Process/Goal: Utilize the theoretical framework of group therapy established by Celena Kite, Relational Cultural Theory and Rogerian approaches to facilitate relational empathy and use of the "here and now" to foster reflection, self-awareness, and sharing.   Observations: Benjamin Shields was actively engaged in the group disussion. He shared with honesty and this invited peer engagement, reframing and mutual empathy.  Benjamin Shields.Div

## 2024-10-13 NOTE — Plan of Care (Signed)

## 2024-10-13 NOTE — Progress Notes (Signed)
 [facility] MD Progress Note  10/13/2024 9:21 AM Benjamin Shields  MRN:  [id number]   Subjective:  Chart reviewed, case discussed in multidisciplinary meeting, patient seen during rounds.   10/16: On interview today, patient is found resting in bed.  He denies SI/HI/plan and denies hallucinations.  Shortly into interview, patient became angry, shouting at provider that he wants to go home and that he feels like he does not need to be here.  He states his girlfriend made a mistake when she described his suicide attempt on patient's initial ED presentation. Patient states he was drunk at the time of the incident and made a mistake.  Provider discussed with patient need for thorough evaluation, treatment and safety monitoring. This was also discussed with patient's girlfriend, [place], today with patient's consent.  Patient has not been engaged in interview or treatment planning and does not provide psychiatric history when asked.  Patient declines psychotropic medication at this time, also declines restarting home medication risperidone.  Patient declined to participate further in interview. Patient was followed by hospitalist for lethargy, which has improved.  Hospitalist has signed off.  Per chart review, patient has had previous episodes of paranoia and suicidal ideation as well as previous suicide attempt by hanging.  He had a previous psychiatric hospitalization in 2021.  Previous documented psychiatric medication trials include Abilify, Zyprexa , Seroquel, amantadine, Zoloft, fluoxetine.  10/15: On interview today, patient is found lying in bed.  He is noted to have a flat affect.  He continues to show poor insight into his mental health condition.  He denies current depressive symptoms or anxiety.  He denies SI/HI/plan and denies hallucinations.  He denies incident involving electrical cord wrapped around neck was a suicide attempt.  He is unable to state why the cord was wrapped around his neck.  Per ED  documentation, patient's girlfriend endorsed witnessing patient stepping off stool with cord wrapped around his neck, after which point the electric cord broke and patient fell to floor.  Patient has requested discharge, he is under IVC, provider discussed with patient need for ongoing safety monitoring and evaluation of psychiatric needs, patient verbalized understanding.   Sleep: Good  Appetite:  Fair  Past Psychiatric History: see h&P Family History: History reviewed. No pertinent family history. Social History:  Social History   Substance and Sexual Activity  Alcohol Use Yes   Comment: rarely     Social History   Substance and Sexual Activity  Drug Use Yes   Types: Marijuana, Amphetamines, Cocaine    Social History   Socioeconomic History   Marital status: Single    Spouse name: Not on file   Number of children: Not on file   Years of education: Not on file   Highest education level: Not on file  Occupational History   Not on file  Tobacco Use   Smoking status: Some Days    Types: Cigarettes   Smokeless tobacco: Never  Vaping Use   Vaping status: Some Days  Substance and Sexual Activity   Alcohol use: Yes    Comment: rarely   Drug use: Yes    Types: Marijuana, Amphetamines, Cocaine   Sexual activity: Not on file  Other Topics Concern   Not on file  Social History Narrative   Not on file   Social Drivers of Health   Financial Resource Strain: Not on file  Food Insecurity: No Food Insecurity (10/10/2024)   Hunger Vital Sign    Worried About Running Out of Food in  the Last Year: Never true    Ran Out of Food in the Last Year: Never true  Transportation Needs: No Transportation Needs (10/10/2024)   PRAPARE - Administrator, Civil Service (Medical): No    Lack of Transportation (Non-Medical): No  Physical Activity: Not on file  Stress: Not on file  Social Connections: Unknown (05/13/2022)   Received from [facility]   Social Network    Social  Network: Not on file   Past Medical History:  Past Medical History:  Diagnosis Date   Asthma    Diabetes mellitus without complication (HCC)    High cholesterol    Hypertension    Pericarditis    Thyroid disease     Past Surgical History:  Procedure Laterality Date   VARICOSE VEIN SURGERY      Current Medications: Current Facility-Administered Medications  Medication Dose Route Frequency Provider Last Rate Last Admin   acetaminophen  (TYLENOL ) tablet 650 mg  650 mg Oral Q6H PRN Coleman, Carolyn H, NP   650 mg at 10/12/24 0918   albuterol  (VENTOLIN  HFA) 108 (90 Base) MCG/ACT inhaler 2 puff  2 puff Inhalation Q6H PRN Puanani Gene L, PA-C       alum & mag hydroxide-simeth (MAALOX/MYLANTA) 200-200-20 MG/5ML suspension 30 mL  30 mL Oral Q4H PRN Mardy Elveria DEL, NP       haloperidol (HALDOL) tablet 5 mg  5 mg Oral TID PRN Mardy Elveria DEL, NP       And   diphenhydrAMINE (BENADRYL) capsule 50 mg  50 mg Oral TID PRN Mardy Elveria DEL, NP       haloperidol lactate (HALDOL) injection 5 mg  5 mg Intramuscular TID PRN Mardy Elveria DEL, NP       And   diphenhydrAMINE (BENADRYL) injection 50 mg  50 mg Intramuscular TID PRN Coleman, Carolyn H, NP       And   LORazepam (ATIVAN) injection 2 mg  2 mg Intramuscular TID PRN Mardy Elveria DEL, NP       haloperidol lactate (HALDOL) injection 10 mg  10 mg Intramuscular TID PRN Mardy Elveria DEL, NP       And   diphenhydrAMINE (BENADRYL) injection 50 mg  50 mg Intramuscular TID PRN Coleman, Carolyn H, NP       And   LORazepam (ATIVAN) injection 2 mg  2 mg Intramuscular TID PRN Coleman, Carolyn H, NP       magnesium hydroxide (MILK OF MAGNESIA) suspension 30 mL  30 mL Oral Daily PRN Coleman, Carolyn H, NP       nicotine (NICODERM CQ - dosed in mg/24 hours) patch 21 mg  21 mg Transdermal Daily Mardy Elveria DEL, NP       ondansetron (ZOFRAN) tablet 4 mg  4 mg Oral Q8H PRN Mardy Elveria DEL, NP        Lab Results:  No results found for  this or any previous visit (from the past 48 hours).   Blood Alcohol level:  Lab Results  Component Value Date   [facility] <15 10/10/2024    Metabolic Disorder Labs: No results found for: HGBA1C, MPG No results found for: PROLACTIN No results found for: CHOL, TRIG, HDL, CHOLHDL, VLDL, LDLCALC  Physical Findings: AIMS:  , ,  ,  ,    CIWA:    COWS:      Psychiatric Specialty Exam:  Presentation  General Appearance:  Appropriate for Environment  Eye Contact: Minimal  Speech: Normal  Speech  Volume: Normal    Mood and Affect  Mood: Irritable  Affect: Congruent     Thought Process  Thought Processes: Coherent; Linear  Descriptions of Associations:Intact  Orientation:Full (Time, Place and Person)  Thought Content:WDL  Hallucinations: None  Ideas of Reference:None  Suicidal Thoughts: No  Homicidal Thoughts: No   Sensorium  Memory: Immediate Fair; Remote Fair  Judgment: Poor  Insight: Poor   Executive Functions  Concentration: Fair  Attention Span: Fair  Recall: Fair  Fund of Knowledge: Fair  Language: Fair   Psychomotor Activity  Psychomotor Activity: Normal  Musculoskeletal: Strength & Muscle Tone: within normal limits Gait & Station: normal Assets  Assets: Social Support; Housing    Physical Exam: Physical Exam ROS Blood pressure 104/66, pulse 60, temperature 98.3 F (36.8 C), temperature source Oral, resp. rate 20, height 5' 10 (1.778 m), weight 62.6 kg, SpO2 98%. Body mass index is 19.8 kg/m.  Diagnosis: Principal Problem:   MDD (major depressive disorder), severe (HCC) Active Problems:   Suicide attempt (HCC)   Hanging, initial encounter   Lethargy   Somnolence- suspect medication related/Acute toxic encephalopathy   PLAN: Safety and Monitoring:  -- Voluntary admission to inpatient psychiatric unit for safety, stabilization and treatment  -- Daily contact with patient to assess and  evaluate symptoms and progress in treatment  -- Patient's case to be discussed in multi-disciplinary team meeting  -- Observation Level : q15 minute checks  -- Vital signs:  q12 hours  -- Precautions: suicide, elopement, and assault -- Encouraged patient to participate in unit milieu and in scheduled group therapies   2. Psychiatric Diagnoses and Treatment:    MDD, recurrent, severe  Patient declines psychotropic medication at this time.  He does not meet criteria for nonemergent enforced medication at this time.   -- The risks/benefits/side-effects/alternatives to this medication were discussed in detail with the patient and time was given for questions. The patient consents to medication trial.                -- Metabolic profile and EKG monitoring obtained while on an atypical antipsychotic (BMI: Lipid Panel: HbgA1c: QTc:)              -- Encouraged patient to participate in unit milieu and in scheduled group therapies                 Hospital Course:     3. Medical Issues Being Addressed:  Evaluated by hospitalist for lethargy, improving, hospitalist has signed off.    4. Discharge Planning:   -- Social work and case management to assist with discharge planning and identification of hospital follow-up needs prior to discharge  -- Estimated LOS: 5-7 days  [employer], PA-C 10/13/2024, 9:21 AM

## 2024-10-13 NOTE — Group Note (Signed)
 LCSW Group Therapy Note  Group Date: 10/13/2024 Start Time: 1315 End Time: 1400   Type of Therapy and Topic:  Group Therapy: Anger Cues and Responses  Participation Level:  None   Description of Group:   In this group, patients learned how to recognize the physical, cognitive, emotional, and behavioral responses they have to anger-provoking situations.  They identified a recent time they became angry and how they reacted.  They analyzed how their reaction was possibly beneficial and how it was possibly unhelpful.  The group discussed a variety of healthier coping skills that could help with such a situation in the future.  Focus was placed on how helpful it is to recognize the underlying emotions to our anger, because working on those can lead to a more permanent solution as well as our ability to focus on the important rather than the urgent.  Therapeutic Goals: Patients will remember their last incident of anger and how they felt emotionally and physically, what their thoughts were at the time, and how they behaved. Patients will identify how their behavior at that time worked for them, as well as how it worked against them. Patients will explore possible new behaviors to use in future anger situations. Patients will learn that anger itself is normal and cannot be eliminated, and that healthier reactions can assist with resolving conflict rather than worsening situations.  Summary of Patient Progress:   Patient was present in group. Patient was preoccupied with discussing how he would like to be discharged from the hospital.  He reports that it was all a misunderstanding and my girlfriend realizes that she made a mistake.  Outside of discussing wanting to go home patient did not participate in discussion.   Therapeutic Modalities:   Cognitive Behavioral Therapy    Sherryle JINNY Margo, LCSW 10/13/2024  2:42 PM

## 2024-10-13 NOTE — Group Note (Signed)
 Date:  10/13/2024 Time:  9:05 PM  Group Topic/Focus:  Coping With Mental Health Crisis:   The purpose of this group is to help patients identify strategies for coping with mental health crisis.  Group discusses possible causes of crisis and ways to manage them effectively.    Participation Level:  Active  Participation Quality:  Appropriate  Affect:  Appropriate  Cognitive:  Appropriate  Insight: Appropriate  Engagement in Group:  Engaged  Modes of Intervention:  Education  Additional Comments:    Kunio Cummiskey L 10/13/2024, 9:05 PM

## 2024-10-13 NOTE — Plan of Care (Signed)
   Problem: Education: Goal: Emotional status will improve Outcome: Progressing Goal: Mental status will improve Outcome: Progressing

## 2024-10-14 NOTE — Progress Notes (Signed)
 Patient states  I feel ok now but I feel better at home. Patient visible in the milieu. Appropriate with staff & peers. No irritable behaviors noted at this time. Support and encouragement given.

## 2024-10-14 NOTE — Group Note (Signed)
 Date:  10/14/2024 Time:  8:57 PM  Group Topic/Focus:  Emotional Education:   The focus of this group is to discuss what feelings/emotions are, and how they are experienced.    Pt did not attend group.   Christina Gintz L 10/14/2024, 8:57 PM

## 2024-10-14 NOTE — Plan of Care (Signed)
 Patient visible in the milieu. Appropriate with staff & peers. No irritable behaviors noted.

## 2024-10-14 NOTE — Progress Notes (Signed)
 [facility] MD Progress Note  10/14/2024 12:47 PM Benjamin Shields  MRN:  [id number]   Subjective:  Chart reviewed, case discussed in multidisciplinary meeting, patient seen during rounds.   10/17: Meeting held with patient's treatment team including attending physician Dr. Donnelly, Camelia Lukes, PA-C, social worker Alveta Kerns and patient's nurse [facility].  Patient's girlfriend, Benjamin Shields was also present via phone call during meeting. Patient was unwilling to discuss his treatment goals, stated he was unwilling to take medication, and repeatedly stated he wants to be discharged, making threats to attempt to escape the unit if not discharged.  Patient became verbally aggressive during the meeting, raising voice to staff.  He maintains that the incident involving suspected suicide attempt was a mistake and that he was intoxicated during the incident.  He stated he is not at risk to harm himself or others.  Patient unwilling to participate further in meaningful interview.  10/16: On interview today, patient is found resting in bed.  He denies SI/HI/plan and denies hallucinations.  Shortly into interview, patient became angry, shouting at provider that he wants to go home and that he feels like he does not need to be here.  He states his girlfriend made a mistake when she described his suicide attempt on patient's initial ED presentation. Patient states he was drunk at the time of the incident and made a mistake.  Provider discussed with patient need for thorough evaluation, treatment and safety monitoring. This was also discussed with patient's girlfriend, Benjamin, today with patient's consent.  Patient has not been engaged in interview or treatment planning and does not provide psychiatric history when asked.  Patient declines psychotropic medication at this time, also declines restarting home medication risperidone.  Patient declined to participate further in interview. Patient was followed  by hospitalist for lethargy, which has improved.  Hospitalist has signed off.  Per chart review, patient has had previous episodes of paranoia and suicidal ideation as well as previous suicide attempt by hanging.  He had a previous psychiatric hospitalization in 2021.  Previous documented psychiatric medication trials include Abilify, Zyprexa , Seroquel, amantadine, Zoloft, fluoxetine.  10/15: On interview today, patient is found lying in bed.  He is noted to have a flat affect.  He continues to show poor insight into his mental health condition.  He denies current depressive symptoms or anxiety.  He denies SI/HI/plan and denies hallucinations.  He denies incident involving electrical cord wrapped around neck was a suicide attempt.  He is unable to state why the cord was wrapped around his neck.  Per ED documentation, patient's girlfriend endorsed witnessing patient stepping off stool with cord wrapped around his neck, after which point the electric cord broke and patient fell to floor.  Patient has requested discharge, he is under IVC, provider discussed with patient need for ongoing safety monitoring and evaluation of psychiatric needs, patient verbalized understanding.   Sleep: Good  Appetite:  Fair  Past Psychiatric History: see h&P Family History: History reviewed. No pertinent family history. Social History:  Social History   Substance and Sexual Activity  Alcohol Use Yes   Comment: rarely     Social History   Substance and Sexual Activity  Drug Use Yes   Types: Marijuana, Amphetamines, Cocaine    Social History   Socioeconomic History   Marital status: Single    Spouse name: Not on file   Number of children: Not on file   Years of education: Not on file   Highest education level:  Not on file  Occupational History   Not on file  Tobacco Use   Smoking status: Some Days    Types: Cigarettes   Smokeless tobacco: Never  Vaping Use   Vaping status: Some Days  Substance and  Sexual Activity   Alcohol use: Yes    Comment: rarely   Drug use: Yes    Types: Marijuana, Amphetamines, Cocaine   Sexual activity: Not on file  Other Topics Concern   Not on file  Social History Narrative   Not on file   Social Drivers of Health   Financial Resource Strain: Not on file  Food Insecurity: No Food Insecurity (10/10/2024)   Hunger Vital Sign    Worried About Running Out of Food in the Last Year: Never true    Ran Out of Food in the Last Year: Never true  Transportation Needs: No Transportation Needs (10/10/2024)   PRAPARE - Administrator, Civil Service (Medical): No    Lack of Transportation (Non-Medical): No  Physical Activity: Not on file  Stress: Not on file  Social Connections: Unknown (05/13/2022)   Received from [facility]   Social Network    Social Network: Not on file   Past Medical History:  Past Medical History:  Diagnosis Date   Asthma    Diabetes mellitus without complication (HCC)    High cholesterol    Hypertension    Pericarditis    Thyroid disease     Past Surgical History:  Procedure Laterality Date   VARICOSE VEIN SURGERY      Current Medications: Current Facility-Administered Medications  Medication Dose Route Frequency Provider Last Rate Last Admin   acetaminophen  (TYLENOL ) tablet 650 mg  650 mg Oral Q6H PRN Coleman, Carolyn H, NP   650 mg at 10/12/24 0918   albuterol  (VENTOLIN  HFA) 108 (90 Base) MCG/ACT inhaler 2 puff  2 puff Inhalation Q6H PRN Kemaya Dorner L, PA-C       alum & mag hydroxide-simeth (MAALOX/MYLANTA) 200-200-20 MG/5ML suspension 30 mL  30 mL Oral Q4H PRN Mardy Elveria DEL, NP       haloperidol (HALDOL) tablet 5 mg  5 mg Oral TID PRN Mardy Elveria DEL, NP       And   diphenhydrAMINE (BENADRYL) capsule 50 mg  50 mg Oral TID PRN Mardy Elveria DEL, NP       haloperidol lactate (HALDOL) injection 5 mg  5 mg Intramuscular TID PRN Mardy Elveria DEL, NP       And   diphenhydrAMINE (BENADRYL)  injection 50 mg  50 mg Intramuscular TID PRN Coleman, Carolyn H, NP       And   LORazepam (ATIVAN) injection 2 mg  2 mg Intramuscular TID PRN Mardy Elveria DEL, NP       haloperidol lactate (HALDOL) injection 10 mg  10 mg Intramuscular TID PRN Mardy Elveria DEL, NP       And   diphenhydrAMINE (BENADRYL) injection 50 mg  50 mg Intramuscular TID PRN Coleman, Carolyn H, NP       And   LORazepam (ATIVAN) injection 2 mg  2 mg Intramuscular TID PRN Coleman, Carolyn H, NP       hydrOXYzine  (ATARAX ) tablet 25 mg  25 mg Oral TID PRN Deedee Lybarger L, PA-C       magnesium hydroxide (MILK OF MAGNESIA) suspension 30 mL  30 mL Oral Daily PRN Coleman, Carolyn H, NP       nicotine (NICODERM CQ - dosed in  mg/24 hours) patch 21 mg  21 mg Transdermal Daily Mardy Elveria DEL, NP       ondansetron (ZOFRAN) tablet 4 mg  4 mg Oral Q8H PRN Mardy Elveria DEL, NP        Lab Results:  No results found for this or any previous visit (from the past 48 hours).   Blood Alcohol level:  Lab Results  Component Value Date   [facility] <15 10/10/2024    Metabolic Disorder Labs: No results found for: HGBA1C, MPG No results found for: PROLACTIN No results found for: CHOL, TRIG, HDL, CHOLHDL, VLDL, LDLCALC  Physical Findings: AIMS:  , ,  ,  ,    CIWA:    COWS:      Psychiatric Specialty Exam:  Presentation  General Appearance:  Appropriate for Environment  Eye Contact: Minimal  Speech: Normal  Speech Volume: Increased  Mood and Affect  Mood: Irritable, angry  Affect: Congruent     Thought Process  Thought Processes: Coherent; Linear  Descriptions of Associations:Intact  Orientation:Full (Time, Place and Person)  Thought Content:WDL  Hallucinations: None  Ideas of Reference:None  Suicidal Thoughts: No  Homicidal Thoughts: No   Sensorium  Memory: Immediate Fair; Remote Fair  Judgment: Poor  Insight: Poor   Executive Functions   Concentration: Fair  Attention Span: Fair  Recall: Fair  Fund of Knowledge: Fair  Language: Fair   Psychomotor Activity  Psychomotor Activity: Normal  Musculoskeletal: Strength & Muscle Tone: within normal limits Gait & Station: normal Assets  Assets: Social Support; Housing    Physical Exam: Physical Exam ROS Blood pressure 99/68, pulse 86, temperature 98.3 F (36.8 C), temperature source Oral, resp. rate 20, height 5' 10 (1.778 m), weight 62.6 kg, SpO2 99%. Body mass index is 19.8 kg/m.  Diagnosis: Principal Problem:   MDD (major depressive disorder), severe (HCC) Active Problems:   Suicide attempt (HCC)   Hanging, initial encounter   Lethargy   Somnolence- suspect medication related/Acute toxic encephalopathy   PLAN: Safety and Monitoring:  -- Voluntary admission to inpatient psychiatric unit for safety, stabilization and treatment  -- Daily contact with patient to assess and evaluate symptoms and progress in treatment  -- Patient's case to be discussed in multi-disciplinary team meeting  -- Observation Level : q15 minute checks  -- Vital signs:  q12 hours  -- Precautions: suicide, elopement, and assault -- Encouraged patient to participate in unit milieu and in scheduled group therapies   2. Psychiatric Diagnoses and Treatment:    MDD, recurrent, severe  Patient declines psychotropic medication at this time.  He does not meet criteria for nonemergent enforced medication at this time.   -- The risks/benefits/side-effects/alternatives to this medication were discussed in detail with the patient and time was given for questions. The patient consents to medication trial.                -- Metabolic profile and EKG monitoring obtained while on an atypical antipsychotic (BMI: Lipid Panel: HbgA1c: QTc:)              -- Encouraged patient to participate in unit milieu and in scheduled group therapies                 Hospital Course:     3. Medical  Issues Being Addressed:  Evaluated by hospitalist for lethargy on admission, improving, hospitalist has signed off.    4. Discharge Planning:   -- Social work and case management to assist with discharge planning and  identification of hospital follow-up needs prior to discharge  -- Estimated LOS: 5-7 days  Camelia LITTIE Lukes, PA-C 10/14/2024, 12:47 PM

## 2024-10-14 NOTE — B[facility] Counselor (Signed)
 Patient seen sitting in the dayroom with peers watching television.   CSW touched base with patient to assess for any needs and patient spoke with CSW and expressed no needs at this time.   Patient was non threatening during this encounter and has appeared to have calmed.   CSW to continue to assess.   Zaiah Eckerson, MSW, LCSWA 10/14/2024 12:56 PM

## 2024-10-14 NOTE — B[facility] Counselor (Addendum)
 Treatment team conducted with patient and his care team to discuss patient's concerns with his current IVC. Patient reported that during his recent episode he "was intoxicated." During meeting patient reported "I am not willing to take my medication." Patient's girlfriend was also present via phone during the meeting and reported "he was intoxicated. It was a mistake and may have been for attention." Girlfriend reported "He does take his medication and his psychiatrist manages his medications." Girlfriend did endorse relationship stressors and reported that this may have been a factor in the patient's current episode. During meeting patient was aggressive and continue to be resistant to treatment. Provider and team further explained IVC criteria to patient.   CSW further spoke with girlfriend about IVC criteria and answered any questions that the girlfriend had to provide further clarity. Girlfriend expressed understanding and was calm when speaking with CSW.   CSW to continue to assess.   Suzi Hernan, MSW, LCSWA 10/14/2024 11:34 AM

## 2024-10-14 NOTE — Group Note (Signed)
 Date:  10/14/2024 Time:  11:05 AM  Group Topic/Focus:  Goals Group:   The focus of this group is to help patients establish daily goals to achieve during treatment and discuss how the patient can incorporate goal setting into their daily lives to aide in recovery.    Participation Level:  Did Not Attend   Benjamin Shields 10/14/2024, 11:05 AM

## 2024-10-14 NOTE — Plan of Care (Signed)
   Problem: Education: Goal: Knowledge of Benjamin Shields General Education information/materials will improve Outcome: Progressing Goal: Emotional status will improve Outcome: Progressing Goal: Mental status will improve Outcome: Progressing Goal: Verbalization of understanding the information provided will improve Outcome: Progressing   Problem: Activity: Goal: Interest or engagement in activities will improve Outcome: Progressing Goal: Sleeping patterns will improve Outcome: Progressing   Problem: Coping: Goal: Ability to verbalize frustrations and anger appropriately will improve Outcome: Progressing Goal: Ability to demonstrate self-control will improve Outcome: Progressing

## 2024-10-14 NOTE — Group Note (Signed)
 Recreation Therapy Group Note   Group Topic:Leisure Education  Group Date: 10/14/2024 Start Time: 1020 End Time: 1130 Facilitators: Celestia Jeoffrey BRAVO, LRT, CTRS Location: Craft Room  Group Description: Leisure. Patients were given the option to choose from journaling, coloring, drawing, making origami, playing with playdoh, listening to music or singing karaoke. LRT and pts discussed the meaning of leisure, the importance of participating in leisure during their free time/when they're outside of the hospital, as well as how our leisure interests can also serve as coping skills.   Goal Area(s) Addressed:  Patient will identify a current leisure interest.  Patient will learn the definition of "leisure". Patient will practice making a positive decision. Patient will have the opportunity to try a new leisure activity. Patient will communicate with peers and LRT.   Affect/Mood: Flat   Participation Level: Minimal    Clinical Observations/Individualized Feedback: Benjamin Shields was present in group for 5 minutes towards the end.   Plan: Continue to engage patient in RT group sessions 2-3x/week.   Jeoffrey BRAVO Celestia, LRT, CTRS 10/14/2024 1:17 PM

## 2024-10-15 MED ORDER — RISPERIDONE 1 MG PO TABS
0.5000 mg | ORAL_TABLET | Freq: Two times a day (BID) | ORAL | Status: DC
  Administered 2024-10-15 – 2024-10-17 (×4): 0.5 mg via ORAL
  Filled 2024-10-15 (×4): qty 1

## 2024-10-15 NOTE — Progress Notes (Signed)
 Pt observed visiting with a male he identified as his fiancee; informed staff she is supportive.  Pt expressed frustration that he is still here adding I want to get out of here.  He denied SI/HI, depression and anxiety.   10/15/24 2200  Psych Admission Type (Psych Patients Only)  Admission Status Involuntary  Psychosocial Assessment  Patient Complaints None  Eye Contact Avertive  Facial Expression Animated  Affect Irritable  Speech Logical/coherent  Interaction Assertive  Motor Activity Other (Comment) (WDL)  Appearance/Hygiene Unremarkable  Behavior Characteristics Appropriate to situation  Mood Irritable  Thought Process  Coherency WDL  Content WDL  Delusions None reported or observed  Perception WDL  Hallucination None reported or observed  Judgment Limited  Confusion None  Danger to Self  Current suicidal ideation? Denies  Danger to Others  Danger to Others None reported or observed

## 2024-10-15 NOTE — Group Note (Signed)
 Date:  10/15/2024 Time:  9:55 PM  Group Topic/Focus:  Wrap-Up Group:   The focus of this group is to help patients review their daily goal of treatment and discuss progress on daily workbooks.    Participation Level:  Active  Participation Quality:  Appropriate and Attentive  Affect:  Appropriate  Cognitive:  Appropriate  Insight: Appropriate  Engagement in Group:  Engaged  Modes of Intervention:  Discussion  Additional Comments:     Kerri Katz 10/15/2024, 9:55 PM

## 2024-10-15 NOTE — Progress Notes (Signed)
 [facility] MD Progress Note  10/15/2024 11:17 AM Benjamin Shields  MRN:  [id number]   Subjective:  Chart reviewed, case discussed in multidisciplinary meeting, patient seen during rounds.   10/18: Patient is assessed on the inpatient unit in his room. He continues to present with labile mood however he was able to engage in his interview this morning. Patient continues to be discharged focused and has concerns of being terminated from his two jobs at [employer] and [employer]. Patient was reminded of his anger outbursts over the course of his admissions during his assessments and his refusal to restart his home medications. His insight continues to be poor regarding his mental health and reason for this admission. Discussed with patient that he is high risk for discharge given his history or suicide attempt, self harm, uncooperativeness, noncompliance and insight. He is agreeable at this time to restart risperidone and staff and providers will monitor sedation. Patient states that he is not sleeping well due to wanting to be discharge and stress regarding employment. Since his admission, he has continued to be in contact with his GF. Patient gives permission to provider to contact GF today. He continues to deny SI, HI, AVH.  Talked with GF, she confirmed her identity with patient's name and DOB, and she reports that she has talked with patient psychiatrist and he is now aware of his admission. His GF displays some enabling traits to patients behavior as well and now minimizing the incident. Discussed with GF that patient has been uncooperative and not engaging with providers and is now agreeable to restart medications. GF voiced understanding.   10/17: Meeting held with patient's treatment team including attending physician Dr. Donnelly, Benjamin Lukes, PA-C, social worker Benjamin Shields and patient's nurse Benjamin Shields.  Patient's girlfriend, Benjamin Shields was also present via phone call during meeting. Patient  was unwilling to discuss his treatment goals, stated he was unwilling to take medication, and repeatedly stated he wants to be discharged, making threats to attempt to escape the unit if not discharged.  Patient became verbally aggressive during the meeting, raising voice to staff.  He maintains that the incident involving suspected suicide attempt was a mistake and that he was intoxicated during the incident.  He stated he is not at risk to harm himself or others.  Patient unwilling to participate further in meaningful interview.  10/16: On interview today, patient is found resting in bed.  He denies SI/HI/plan and denies hallucinations.  Shortly into interview, patient became angry, shouting at provider that he wants to go home and that he feels like he does not need to be here.  He states his girlfriend made a mistake when she described his suicide attempt on patient's initial ED presentation. Patient states he was drunk at the time of the incident and made a mistake.  Provider discussed with patient need for thorough evaluation, treatment and safety monitoring. This was also discussed with patient's girlfriend, Benjamin, today with patient's consent.  Patient has not been engaged in interview or treatment planning and does not provide psychiatric history when asked.  Patient declines psychotropic medication at this time, also declines restarting home medication risperidone.  Patient declined to participate further in interview. Patient was followed by hospitalist for lethargy, which has improved.  Hospitalist has signed off.  Per chart review, patient has had previous episodes of paranoia and suicidal ideation as well as previous suicide attempt by hanging.  He had a previous psychiatric hospitalization in 2021.  Previous documented psychiatric medication  trials include Abilify, Zyprexa , Seroquel, amantadine, Zoloft, fluoxetine.  10/15: On interview today, patient is found lying in bed.  He is noted  to have a flat affect.  He continues to show poor insight into his mental health condition.  He denies current depressive symptoms or anxiety.  He denies SI/HI/plan and denies hallucinations.  He denies incident involving electrical cord wrapped around neck was a suicide attempt.  He is unable to state why the cord was wrapped around his neck.  Per ED documentation, patient's girlfriend endorsed witnessing patient stepping off stool with cord wrapped around his neck, after which point the electric cord broke and patient fell to floor.  Patient has requested discharge, he is under IVC, provider discussed with patient need for ongoing safety monitoring and evaluation of psychiatric needs, patient verbalized understanding.   Sleep: Good  Appetite:  Fair  Past Psychiatric History: see h&P Family History: History reviewed. No pertinent family history. Social History:  Social History   Substance and Sexual Activity  Alcohol Use Yes   Comment: rarely     Social History   Substance and Sexual Activity  Drug Use Yes   Types: Marijuana, Amphetamines, Cocaine    Social History   Socioeconomic History   Marital status: Single    Spouse name: Not on file   Number of children: Not on file   Years of education: Not on file   Highest education level: Not on file  Occupational History   Not on file  Tobacco Use   Smoking status: Some Days    Types: Cigarettes   Smokeless tobacco: Never  Vaping Use   Vaping status: Some Days  Substance and Sexual Activity   Alcohol use: Yes    Comment: rarely   Drug use: Yes    Types: Marijuana, Amphetamines, Cocaine   Sexual activity: Not on file  Other Topics Concern   Not on file  Social History Narrative   Not on file   Social Drivers of Health   Financial Resource Strain: Not on file  Food Insecurity: No Food Insecurity (10/10/2024)   Hunger Vital Sign    Worried About Running Out of Food in the Last Year: Never true    Ran Out of Food in the  Last Year: Never true  Transportation Needs: No Transportation Needs (10/10/2024)   PRAPARE - Administrator, Civil Service (Medical): No    Lack of Transportation (Non-Medical): No  Physical Activity: Not on file  Stress: Not on file  Social Connections: Unknown (05/13/2022)   Received from [facility] [place]   Social Network    Social Network: Not on file   Past Medical History:  Past Medical History:  Diagnosis Date   Asthma    Diabetes mellitus without complication (HCC)    High cholesterol    Hypertension    Pericarditis    Thyroid disease     Past Surgical History:  Procedure Laterality Date   VARICOSE VEIN SURGERY      Current Medications: Current Facility-Administered Medications  Medication Dose Route Frequency Provider Last Rate Last Admin   acetaminophen  (TYLENOL ) tablet 650 mg  650 mg Oral Q6H PRN Coleman, Carolyn H, NP   650 mg at 10/12/24 0918   albuterol  (VENTOLIN  HFA) 108 (90 Base) MCG/ACT inhaler 2 puff  2 puff Inhalation Q6H PRN Hunter, Crystal L, PA-C       alum & mag hydroxide-simeth (MAALOX/MYLANTA) 200-200-20 MG/5ML suspension 30 mL  30 mL Oral Q4H PRN Mardy,  Elveria DEL, NP       haloperidol (HALDOL) tablet 5 mg  5 mg Oral TID PRN Mardy Elveria DEL, NP       And   diphenhydrAMINE (BENADRYL) capsule 50 mg  50 mg Oral TID PRN Mardy Elveria DEL, NP       haloperidol lactate (HALDOL) injection 5 mg  5 mg Intramuscular TID PRN Mardy Elveria DEL, NP       And   diphenhydrAMINE (BENADRYL) injection 50 mg  50 mg Intramuscular TID PRN Coleman, Carolyn H, NP       And   LORazepam (ATIVAN) injection 2 mg  2 mg Intramuscular TID PRN Mardy Elveria DEL, NP       haloperidol lactate (HALDOL) injection 10 mg  10 mg Intramuscular TID PRN Mardy Elveria DEL, NP       And   diphenhydrAMINE (BENADRYL) injection 50 mg  50 mg Intramuscular TID PRN Coleman, Carolyn H, NP       And   LORazepam (ATIVAN) injection 2 mg  2 mg Intramuscular TID PRN Coleman, Carolyn H,  NP       hydrOXYzine  (ATARAX ) tablet 25 mg  25 mg Oral TID PRN Hunter, Crystal L, PA-C       magnesium hydroxide (MILK OF MAGNESIA) suspension 30 mL  30 mL Oral Daily PRN Coleman, Carolyn H, NP       nicotine (NICODERM CQ - dosed in mg/24 hours) patch 21 mg  21 mg Transdermal Daily Coleman, Carolyn H, NP       ondansetron (ZOFRAN) tablet 4 mg  4 mg Oral Q8H PRN Mardy Elveria DEL, NP        Lab Results:  No results found for this or any previous visit (from the past 48 hours).   Blood Alcohol level:  Lab Results  Component Value Date   [facility] <15 10/10/2024    Metabolic Disorder Labs: No results found for: HGBA1C, MPG No results found for: PROLACTIN No results found for: CHOL, TRIG, HDL, CHOLHDL, VLDL, LDLCALC  Physical Findings: AIMS:  , ,  ,  ,    CIWA:    COWS:      Psychiatric Specialty Exam:  Presentation  General Appearance:  Appropriate for Environment  Eye Contact: Good  Speech: Normal  Speech Volume: Increased  Mood and Affect  Mood: Irritable, angry  Affect: Congruent     Thought Process  Thought Processes: Linear; Coherent  Descriptions of Associations:Intact  Orientation:Full (Time, Place and Person)  Thought Content:WDL  Hallucinations: None  Ideas of Reference:None  Suicidal Thoughts: No  Homicidal Thoughts: No   Sensorium  Memory: Immediate Good; Recent Good; Remote Poor  Judgment: Fair  Insight: Poor   Executive Functions  Concentration: Good  Attention Span: Good  Recall: Good  Fund of Knowledge: Good  Language: Good   Psychomotor Activity  Psychomotor Activity: Normal  Musculoskeletal: Strength & Muscle Tone: within normal limits Gait & Station: normal Assets  Assets: Housing; Social Support; Advertising copywriter; Physical Health    Physical Exam: Physical Exam Review of Systems  Constitutional: Negative.   HENT: Negative.    Eyes: Negative.   Respiratory: Negative.     Cardiovascular: Negative.   Gastrointestinal: Negative.   Genitourinary: Negative.   Musculoskeletal: Negative.   Skin: Negative.   Neurological: Negative.   Endo/Heme/Allergies: Negative.   Psychiatric/Behavioral: Negative.     Blood pressure (!) 114/58, pulse 74, temperature 98.1 F (36.7 C), temperature source Oral, resp. rate 19, height 5' 10 (1.778  m), weight 62.6 kg, SpO2 100%. Body mass index is 19.8 kg/m.  Diagnosis: Principal Problem:   MDD (major depressive disorder), severe (HCC) Active Problems:   Suicide attempt (HCC)   Hanging, initial encounter   Lethargy   Somnolence- suspect medication related/Acute toxic encephalopathy   PLAN: Safety and Monitoring:  -- Voluntary admission to inpatient psychiatric unit for safety, stabilization and treatment  -- Daily contact with patient to assess and evaluate symptoms and progress in treatment  -- Patient's case to be discussed in multi-disciplinary team meeting  -- Observation Level : q15 minute checks  -- Vital signs:  q12 hours  -- Precautions: suicide, elopement, and assault -- Encouraged patient to participate in unit milieu and in scheduled group therapies   2. Psychiatric Diagnoses and Treatment:    MDD, recurrent, severe Restart risperidone 0.5 mg twice daily  Patient declines psychotropic medication at this time.  He does not meet criteria for nonemergent enforced medication at this time.   -- The risks/benefits/side-effects/alternatives to this medication were discussed in detail with the patient and time was given for questions. The patient consents to medication trial.                -- Metabolic profile and EKG monitoring obtained while on an atypical antipsychotic (BMI: Lipid Panel: HbgA1c: QTc:)              -- Encouraged patient to participate in unit milieu and in scheduled group therapies                 Hospital Course:     3. Medical Issues Being Addressed:  Evaluated by hospitalist for  lethargy on admission, improving, hospitalist has signed off.    4. Discharge Planning:   -- Social work and case management to assist with discharge planning and identification of hospital follow-up needs prior to discharge  -- Estimated LOS: 5-7 days  Daine KATHEE Ober, NP 10/15/2024, 11:17 AM

## 2024-10-15 NOTE — Plan of Care (Signed)
 Euan Wandler is a 53 y.o. male patient. No diagnosis found. Past Medical History:  Diagnosis Date   Asthma    Diabetes mellitus without complication (HCC)    High cholesterol    Hypertension    Pericarditis    Thyroid disease    Current Facility-Administered Medications  Medication Dose Route Frequency Provider Last Rate Last Admin   acetaminophen  (TYLENOL ) tablet 650 mg  650 mg Oral Q6H PRN Coleman, Carolyn H, NP   650 mg at 10/12/24 0918   albuterol  (VENTOLIN  HFA) 108 (90 Base) MCG/ACT inhaler 2 puff  2 puff Inhalation Q6H PRN Hunter, Crystal L, PA-C       alum & mag hydroxide-simeth (MAALOX/MYLANTA) 200-200-20 MG/5ML suspension 30 mL  30 mL Oral Q4H PRN Coleman, Carolyn H, NP       haloperidol (HALDOL) tablet 5 mg  5 mg Oral TID PRN Mardy Elveria DEL, NP       And   diphenhydrAMINE (BENADRYL) capsule 50 mg  50 mg Oral TID PRN Mardy Elveria DEL, NP       haloperidol lactate (HALDOL) injection 5 mg  5 mg Intramuscular TID PRN Mardy Elveria DEL, NP       And   diphenhydrAMINE (BENADRYL) injection 50 mg  50 mg Intramuscular TID PRN Coleman, Carolyn H, NP       And   LORazepam (ATIVAN) injection 2 mg  2 mg Intramuscular TID PRN Mardy Elveria DEL, NP       haloperidol lactate (HALDOL) injection 10 mg  10 mg Intramuscular TID PRN Coleman, Carolyn H, NP       And   diphenhydrAMINE (BENADRYL) injection 50 mg  50 mg Intramuscular TID PRN Coleman, Carolyn H, NP       And   LORazepam (ATIVAN) injection 2 mg  2 mg Intramuscular TID PRN Coleman, Carolyn H, NP       hydrOXYzine  (ATARAX ) tablet 25 mg  25 mg Oral TID PRN Hunter, Crystal L, PA-C       magnesium hydroxide (MILK OF MAGNESIA) suspension 30 mL  30 mL Oral Daily PRN Coleman, Carolyn H, NP       nicotine (NICODERM CQ - dosed in mg/24 hours) patch 21 mg  21 mg Transdermal Daily Coleman, Carolyn H, NP       ondansetron (ZOFRAN) tablet 4 mg  4 mg Oral Q8H PRN Mardy Elveria DEL, NP       Allergies  Allergen Reactions   Banana     Cat Dander Swelling   Penicillins Hives    Pt states he can take amoxicillin  without complication   Principal Problem:   MDD (major depressive disorder), severe (HCC) Active Problems:   Suicide attempt (HCC)   Hanging, initial encounter   Lethargy   Somnolence- suspect medication related/Acute toxic encephalopathy  Blood pressure (!) 114/58, pulse 74, temperature 98.1 F (36.7 C), temperature source Oral, resp. rate 19, height 5' 10 (1.778 m), weight 62.6 kg, SpO2 100%.  Subjective Objective Assessment & Plan  Robbin Loughmiller B Lovely Kerins 10/15/2024

## 2024-10-15 NOTE — Plan of Care (Signed)
   Problem: Education: Goal: Emotional status will improve Outcome: Progressing Goal: Mental status will improve Outcome: Progressing

## 2024-10-15 NOTE — Progress Notes (Signed)
   10/15/24 0900  Psych Admission Type (Psych Patients Only)  Admission Status Involuntary  Psychosocial Assessment  Patient Complaints Worthlessness  Eye Contact Fair  Facial Expression Animated  Affect Irritable  Speech Logical/coherent  Interaction Assertive  Motor Activity Other (Comment) (WNL)  Appearance/Hygiene Unremarkable  Behavior Characteristics Appropriate to situation  Mood Irritable (ready to go home)  Thought Process  Coherency WDL  Content WDL  Delusions None reported or observed  Perception WDL  Hallucination None reported or observed  Judgment Impaired  Confusion None  Danger to Self  Current suicidal ideation? Denies  Danger to Others  Danger to Others None reported or observed

## 2024-10-16 DIAGNOSIS — F332 Major depressive disorder, recurrent severe without psychotic features: Secondary | ICD-10-CM

## 2024-10-16 NOTE — Progress Notes (Signed)
   10/16/24 2053  Psych Admission Type (Psych Patients Only)  Admission Status Involuntary  Psychosocial Assessment  Patient Complaints None  Eye Contact Fair  Facial Expression Animated  Affect Irritable  Speech Logical/coherent  Interaction Assertive  Motor Activity Other (Comment) (WNL)  Appearance/Hygiene Unremarkable  Behavior Characteristics Appropriate to situation  Mood Irritable  Thought Process  Coherency WDL  Content WDL  Delusions None reported or observed  Perception WDL  Hallucination None reported or observed  Judgment Impaired  Confusion None  Danger to Self  Current suicidal ideation? Denies  Danger to Others  Danger to Others None reported or observed

## 2024-10-16 NOTE — Group Note (Signed)
 Date:10/15/2024 Time: 10:30am  Group Topic/Focus:  Conflict Resolution:   The focus of this group is to discuss the conflict resolution process and how it may be used upon discharge.    Participation Level:  Active  Participation Quality:  Appropriate  Affect:  Appropriate  Cognitive:  Alert  Insight: Appropriate  Engagement in Group:  Engaged  Modes of Intervention:  Activity  Additional Comments:    Bonnielee LOISE Pepper 10/16/2024, 7:16 PM

## 2024-10-16 NOTE — Plan of Care (Signed)

## 2024-10-16 NOTE — Plan of Care (Signed)
   Problem: Education: Goal: Emotional status will improve Outcome: Progressing   Problem: Coping: Goal: Ability to verbalize frustrations and anger appropriately will improve Outcome: Progressing

## 2024-10-16 NOTE — Plan of Care (Signed)
  Problem: Education: Goal: Emotional status will improve 10/16/2024 2305 by Zachary Titus BIRCH, RN Outcome: Progressing 10/16/2024 1617 by Zachary Titus BIRCH, RN Outcome: Progressing Goal: Mental status will improve Outcome: Progressing

## 2024-10-16 NOTE — Progress Notes (Signed)
   10/16/24 0810  Psych Admission Type (Psych Patients Only)  Admission Status Involuntary  Psychosocial Assessment  Patient Complaints None  Eye Contact Fair  Facial Expression Animated  Affect Irritable  Speech Logical/coherent  Interaction Assertive  Motor Activity Other (Comment) (WNL)  Appearance/Hygiene Unremarkable  Behavior Characteristics Appropriate to situation  Mood Irritable  Thought Process  Coherency WDL  Content WDL  Delusions None reported or observed  Perception WDL  Hallucination None reported or observed  Judgment Impaired  Confusion None  Danger to Self  Current suicidal ideation? Denies  Danger to Others  Danger to Others None reported or observed

## 2024-10-16 NOTE — Progress Notes (Signed)
 [facility] MD Progress Note  10/16/2024 10:46 AM Benjamin Shields  MRN:  [id number]   Subjective:  Chart reviewed, case discussed in multidisciplinary meeting, patient seen during rounds.   10/19 Patient is found resting in bed with eyes closed and no apparent distress. He reports that he is doing fine and continues to be discharge focused. He has been attending group meetings since yesterday and compliant with medication. He denies side effects from medication. Patient continues to deny any suicidal thoughts, homicidal thoughts, AVH, depression, anxiety, or mood dysregulation. He continues to minimize his mental health history and current needs. There has been no behavioral concerns in the past 24 hours on the unit. Again denies SI, HI, AVH at this time.   10/18: Patient is assessed on the inpatient unit in his room. He continues to present with labile mood however he was able to engage in his interview this morning. Patient continues to be discharged focused and has concerns of being terminated from his two jobs at [employer] and [employer]. Patient was reminded of his anger outbursts over the course of his admissions during his assessments and his refusal to restart his home medications. His insight continues to be poor regarding his mental health and reason for this admission. Discussed with patient that he is high risk for discharge given his history or suicide attempt, self harm, uncooperativeness, noncompliance and insight. He is agreeable at this time to restart risperidone and staff and providers will monitor sedation. Patient states that he is not sleeping well due to wanting to be discharge and stress regarding employment. Since his admission, he has continued to be in contact with his GF. Patient gives permission to provider to contact GF today. He continues to deny SI, HI, AVH.  Talked with GF, she confirmed her identity with patient's name and DOB, and she reports that she has talked with patient  psychiatrist and he is now aware of his admission. His GF displays some enabling traits to patients behavior as well and now minimizing the incident. Discussed with GF that patient has been uncooperative and not engaging with providers and is now agreeable to restart medications. GF voiced understanding.   10/17: Meeting held with patient's treatment team including attending physician Dr. Donnelly, Camelia Lukes, PA-C, social worker Alveta Kerns and patient's nurse [facility].  Patient's girlfriend, [name] was also present via phone call during meeting. Patient was unwilling to discuss his treatment goals, stated he was unwilling to take medication, and repeatedly stated he wants to be discharged, making threats to attempt to escape the unit if not discharged.  Patient became verbally aggressive during the meeting, raising voice to staff.  He maintains that the incident involving suspected suicide attempt was a mistake and that he was intoxicated during the incident.  He stated he is not at risk to harm himself or others.  Patient unwilling to participate further in meaningful interview.  10/16: On interview today, patient is found resting in bed.  He denies SI/HI/plan and denies hallucinations.  Shortly into interview, patient became angry, shouting at provider that he wants to go home and that he feels like he does not need to be here.  He states his girlfriend made a mistake when she described his suicide attempt on patient's initial ED presentation. Patient states he was drunk at the time of the incident and made a mistake.  Provider discussed with patient need for thorough evaluation, treatment and safety monitoring. This was also discussed with patient's girlfriend, Woodroe, today  with patient's consent.  Patient has not been engaged in interview or treatment planning and does not provide psychiatric history when asked.  Patient declines psychotropic medication at this time, also  declines restarting home medication risperidone.  Patient declined to participate further in interview. Patient was followed by hospitalist for lethargy, which has improved.  Hospitalist has signed off.  Per chart review, patient has had previous episodes of paranoia and suicidal ideation as well as previous suicide attempt by hanging.  He had a previous psychiatric hospitalization in 2021.  Previous documented psychiatric medication trials include Abilify, Zyprexa , Seroquel, amantadine, Zoloft, fluoxetine.  10/15: On interview today, patient is found lying in bed.  He is noted to have a flat affect.  He continues to show poor insight into his mental health condition.  He denies current depressive symptoms or anxiety.  He denies SI/HI/plan and denies hallucinations.  He denies incident involving electrical cord wrapped around neck was a suicide attempt.  He is unable to state why the cord was wrapped around his neck.  Per ED documentation, patient's girlfriend endorsed witnessing patient stepping off stool with cord wrapped around his neck, after which point the electric cord broke and patient fell to floor.  Patient has requested discharge, he is under IVC, provider discussed with patient need for ongoing safety monitoring and evaluation of psychiatric needs, patient verbalized understanding.   Sleep: Good  Appetite:  Fair  Past Psychiatric History: see h&P Family History: History reviewed. No pertinent family history. Social History:  Social History   Substance and Sexual Activity  Alcohol Use Yes   Comment: rarely     Social History   Substance and Sexual Activity  Drug Use Yes   Types: Marijuana, Amphetamines, Cocaine    Social History   Socioeconomic History   Marital status: Single    Spouse name: Not on file   Number of children: Not on file   Years of education: Not on file   Highest education level: Not on file  Occupational History   Not on file  Tobacco Use   Smoking  status: Some Days    Types: Cigarettes   Smokeless tobacco: Never  Vaping Use   Vaping status: Some Days  Substance and Sexual Activity   Alcohol use: Yes    Comment: rarely   Drug use: Yes    Types: Marijuana, Amphetamines, Cocaine   Sexual activity: Not on file  Other Topics Concern   Not on file  Social History Narrative   Not on file   Social Drivers of Health   Financial Resource Strain: Not on file  Food Insecurity: No Food Insecurity (10/10/2024)   Hunger Vital Sign    Worried About Running Out of Food in the Last Year: Never true    Ran Out of Food in the Last Year: Never true  Transportation Needs: No Transportation Needs (10/10/2024)   PRAPARE - Administrator, Civil Service (Medical): No    Lack of Transportation (Non-Medical): No  Physical Activity: Not on file  Stress: Not on file  Social Connections: Unknown (05/13/2022)   [facility]   Social Network    Social Network: Not on file   Past Medical History:  Past Medical History:  Diagnosis Date   Asthma    Diabetes mellitus without complication (HCC)    High cholesterol    Hypertension    Pericarditis    Thyroid disease     Past Surgical History:  Procedure Laterality  Date   VARICOSE VEIN SURGERY      Current Medications: Current Facility-Administered Medications  Medication Dose Route Frequency Provider Last Rate Last Admin   acetaminophen  (TYLENOL ) tablet 650 mg  650 mg Oral Q6H PRN Coleman, Carolyn H, NP   650 mg at 10/12/24 0918   albuterol  (VENTOLIN  HFA) 108 (90 Base) MCG/ACT inhaler 2 puff  2 puff Inhalation Q6H PRN Hunter, Crystal L, PA-C       alum & mag hydroxide-simeth (MAALOX/MYLANTA) 200-200-20 MG/5ML suspension 30 mL  30 mL Oral Q4H PRN Coleman, Carolyn H, NP       haloperidol (HALDOL) tablet 5 mg  5 mg Oral TID PRN Mardy Elveria DEL, NP       And   diphenhydrAMINE (BENADRYL) capsule 50 mg  50 mg Oral TID PRN Mardy Elveria DEL, NP       haloperidol lactate  (HALDOL) injection 5 mg  5 mg Intramuscular TID PRN Mardy Elveria DEL, NP       And   diphenhydrAMINE (BENADRYL) injection 50 mg  50 mg Intramuscular TID PRN Coleman, Carolyn H, NP       And   LORazepam (ATIVAN) injection 2 mg  2 mg Intramuscular TID PRN Mardy Elveria DEL, NP       haloperidol lactate (HALDOL) injection 10 mg  10 mg Intramuscular TID PRN Coleman, Carolyn H, NP       And   diphenhydrAMINE (BENADRYL) injection 50 mg  50 mg Intramuscular TID PRN Coleman, Carolyn H, NP       And   LORazepam (ATIVAN) injection 2 mg  2 mg Intramuscular TID PRN Coleman, Carolyn H, NP       hydrOXYzine  (ATARAX ) tablet 25 mg  25 mg Oral TID PRN Hunter, Crystal L, PA-C       magnesium hydroxide (MILK OF MAGNESIA) suspension 30 mL  30 mL Oral Daily PRN Coleman, Carolyn H, NP       nicotine (NICODERM CQ - dosed in mg/24 hours) patch 21 mg  21 mg Transdermal Daily Mardy Elveria DEL, NP       ondansetron (ZOFRAN) tablet 4 mg  4 mg Oral Q8H PRN Mardy Elveria DEL, NP       risperiDONE (RISPERDAL) tablet 0.5 mg  0.5 mg Oral BID Ferol Laiche B, NP   0.5 mg at 10/16/24 0808    Lab Results:  No results found for this or any previous visit (from the past 48 hours).   Blood Alcohol level:  Lab Results  Component Value Date   [facility] <15 10/10/2024    Metabolic Disorder Labs: No results found for: HGBA1C, MPG No results found for: PROLACTIN No results found for: CHOL, TRIG, HDL, CHOLHDL, VLDL, LDLCALC  Physical Findings: AIMS:  , ,  ,  ,    CIWA:    COWS:      Psychiatric Specialty Exam:  Presentation  General Appearance:  Appropriate for Environment  Eye Contact: Other (comment) (resting with eyes closed)  Speech: Normal  Speech Volume: Increased  Mood and Affect  Mood: Irritable, angry  Affect: Congruent     Thought Process  Thought Processes: Coherent; Linear  Descriptions of Associations:Intact  Orientation:Full (Time, Place and Person)  Thought  Content:WDL  Hallucinations: None  Ideas of Reference:None  Suicidal Thoughts: No  Homicidal Thoughts: No   Sensorium  Memory: Immediate Good; Recent Good; Remote Poor  Judgment: Good  Insight: Fair   Executive Functions  Concentration: Good  Attention Span: Good  Recall:  Good  Fund of Knowledge: Good  Language: Good   Psychomotor Activity  Psychomotor Activity: Normal  Musculoskeletal: Strength & Muscle Tone: within normal limits Gait & Station: normal Assets  Assets: Manufacturing systems engineer; Desire for Improvement; Housing; Social Support; Vocational/Educational    Physical Exam: Physical Exam Vitals and nursing note reviewed.  Constitutional:      Appearance: Normal appearance. He is normal weight.  HENT:     Head: Normocephalic.     Nose: Nose normal.  Pulmonary:     Effort: Pulmonary effort is normal.  Abdominal:     General: Abdomen is flat.     Palpations: Abdomen is soft.  Musculoskeletal:        General: Normal range of motion.     Cervical back: Normal range of motion.  Neurological:     General: No focal deficit present.     Mental Status: He is alert and oriented to person, place, and time. Mental status is at baseline.  Psychiatric:        Mood and Affect: Mood normal.        Behavior: Behavior normal.        Thought Content: Thought content normal.        Judgment: Judgment normal.    Review of Systems  Constitutional: Negative.   HENT: Negative.    Eyes: Negative.   Respiratory: Negative.    Cardiovascular: Negative.   Gastrointestinal: Negative.   Genitourinary: Negative.   Musculoskeletal: Negative.   Skin: Negative.   Neurological: Negative.   Endo/Heme/Allergies: Negative.   Psychiatric/Behavioral: Negative.    All other systems reviewed and are negative.  Blood pressure 111/74, pulse 76, temperature 98.1 F (36.7 C), temperature source Oral, resp. rate 16, height 5' 10 (1.778 m), weight 62.6 kg, SpO2 100%.  Body mass index is 19.8 kg/m.  Diagnosis: Principal Problem:   MDD (major depressive disorder), severe (HCC) Active Problems:   Suicide attempt (HCC)   Hanging, initial encounter   Lethargy   Somnolence- suspect medication related/Acute toxic encephalopathy   PLAN: Safety and Monitoring:  -- Voluntary admission to inpatient psychiatric unit for safety, stabilization and treatment  -- Daily contact with patient to assess and evaluate symptoms and progress in treatment  -- Patient's case to be discussed in multi-disciplinary team meeting  -- Observation Level : q15 minute checks  -- Vital signs:  q12 hours  -- Precautions: suicide, elopement, and assault -- Encouraged patient to participate in unit milieu and in scheduled group therapies   2. Psychiatric Diagnoses and Treatment:    MDD, recurrent, severe Continue risperidone 0.5 mg twice daily  Patient declines psychotropic medication at this time.  He does not meet criteria for nonemergent enforced medication at this time.   -- The risks/benefits/side-effects/alternatives to this medication were discussed in detail with the patient and time was given for questions. The patient consents to medication trial.                -- Metabolic profile and EKG monitoring obtained while on an atypical antipsychotic (BMI: Lipid Panel: HbgA1c: QTc:)              -- Encouraged patient to participate in unit milieu and in scheduled group therapies                 Hospital Course:     3. Medical Issues Being Addressed:  Evaluated by hospitalist for lethargy on admission, improving, hospitalist has signed off.    4. Discharge Planning:   --  Social work and case management to assist with discharge planning and identification of hospital follow-up needs prior to discharge  -- Estimated LOS: 5-7 days  Daine KATHEE Ober, NP 10/16/2024, 10:46 AM

## 2024-10-17 MED ORDER — NICOTINE 21 MG/24HR TD PT24: 21.0000 mg | MEDICATED_PATCH | Freq: Every day | TRANSDERMAL | 0 refills | Status: AC

## 2024-10-17 MED ORDER — RISPERIDONE 0.5 MG PO TABS: 0.5000 mg | ORAL_TABLET | Freq: Two times a day (BID) | ORAL | 0 refills | Status: AC

## 2024-10-17 NOTE — Progress Notes (Signed)
 Patient ID: Benjamin Shields, male   DOB: [birth year], 53 y.o.   MRN: [id number]  Discharge Note:  Patient denies SI/HI/AVH at this time. Discharge instructions, AVS, prescriptions, return to work note, and transition record gone over with patient. Patient declined on filling out a Suicide Safety Plan. Patient agrees to comply with medication management, follow-up visit, and outpatient therapy. Patient belongings returned to patient. Patient questions and concerns addressed and answered. Patient ambulatory off unit. Patient discharged to home with his girlfriend.

## 2024-10-17 NOTE — BHH Suicide Risk Assessment (Signed)
 [facility] Discharge Suicide Risk Assessment   Principal Problem: MDD (major depressive disorder), severe (HCC) Discharge Diagnoses: Principal Problem:   MDD (major depressive disorder), severe (HCC) Active Problems:   Suicide attempt (HCC)   Hanging, initial encounter   Lethargy   Somnolence- suspect medication related/Acute toxic encephalopathy   Total Time spent with patient: 30 minutes  Musculoskeletal: Strength & Muscle Tone: within normal limits Gait & Station: normal Patient leans: N/A  Psychiatric Specialty Exam  Presentation  General Appearance:  Appropriate for Environment  Eye Contact: Fair  Speech: Clear and Coherent  Speech Volume: Normal  Handedness: Right   Mood and Affect  Mood: Euthymic  Duration of Depression Symptoms: Greater than two weeks  Affect: Appropriate   Thought Process  Thought Processes: Coherent  Descriptions of Associations:Intact  Orientation:Full (Time, Place and Person)  Thought Content:Logical  History of Schizophrenia/Schizoaffective disorder:No  Duration of Psychotic Symptoms:Less than six months  Hallucinations:Hallucinations: None  Ideas of Reference:None  Suicidal Thoughts:Suicidal Thoughts: No  Homicidal Thoughts:Homicidal Thoughts: No   Sensorium  Memory: Immediate Fair; Recent Fair  Judgment: Fair  Insight: Fair   Art therapist  Concentration: Fair  Attention Span: Fair  Recall: [employer] of Knowledge: Fair  Language: Fair   Psychomotor Activity  Psychomotor Activity: Psychomotor Activity: Normal   Assets  Assets: Communication Skills; Desire for Improvement; Resilience   Sleep  Sleep: Sleep: Fair  Estimated Sleeping Duration (Last 24 Hours): 4.75-6.50 hours  Physical Exam: Physical Exam ROS Blood pressure 98/73, pulse 70, temperature 97.7 F (36.5 C), temperature source Oral, resp. rate 16, height 5' 10 (1.778 m), weight 62.6 kg, SpO2 100%. Body mass  index is 19.8 kg/m.  Mental Status Per Nursing Assessment::   On Admission:  NA  Demographic Factors:  Low socioeconomic status  Loss Factors: Decrease in vocational status  Historical Factors: Impulsivity  Risk Reduction Factors:   Living with another person, especially a relative, Positive social support, Positive therapeutic relationship, and Positive coping skills or problem solving skills  Continued Clinical Symptoms:  Depression:   Comorbid alcohol abuse/dependence  Cognitive Features That Contribute To Risk:  None    Suicide Risk:  Minimal: No identifiable suicidal ideation.  Patients presenting with no risk factors but with morbid ruminations; may be classified as minimal risk based on the severity of the depressive symptoms   Follow-up Information     [facility] [place]. Go to.   Why: In person psychiatry appointment is 10/21/24 at 10 AM with your provider, Jackee Sharps PA-C. Contact information: [address], [address] [place], [place] [address]  Phone: [phone] Fax: [phone]        [facility] Follow up.   Why: Virtual therapy assessment is 10//25 at . Contact information: [address]  [address] [place] [address] [phone]                 Plan Of Care/Follow-up recommendations:  Activity:  As tolerated  Allyn Foil, MD 10/17/2024, 12:11 PM

## 2024-10-17 NOTE — Group Note (Signed)
 Date:  10/17/2024 Time:  10:04 AM  Group Topic/Focus:  Goals Group:   The focus of this group is to help patients establish daily goals to achieve during treatment and discuss how the patient can incorporate goal setting into their daily lives to aide in recovery.    Participation Level:  Active  Participation Quality:  Appropriate  Affect:  Appropriate  Cognitive:  Appropriate  Insight: Appropriate  Engagement in Group:  Engaged  Modes of Intervention:  Activity  Additional Comments:    Camellia HERO Danzel Marszalek 10/17/2024, 10:04 AM

## 2024-10-17 NOTE — Progress Notes (Signed)
  [facility] Adult Case Management Discharge Plan :  Will you be returning to the same living situation after discharge:  Yes,  Patient to return home.  At discharge, do you have transportation home?: Yes,  Patient's partner to provide transportation.  Do you have the ability to pay for your medications: Yes,  [employer] / [employer]  Release of information consent forms completed and in the chart;  Patient's signature needed at discharge.  Patient to Follow up at:  Follow-up Information     [facility] [place]. Go to.   Why: In person psychiatry appointment is 10/21/24 at 10 AM with your provider, Jackee Sharps PA-C. Contact information: [address] [place], [place] [address]  Phone: [phone] Fax: [phone]        [facility] Follow up.   Why: Virtual therapy assessment is 10//25 at . Contact information: [address]  [address] [place] [address] [phone]                 Next level of care provider has access to [facility] Link:no  Safety Planning and Suicide Prevention discussed: Chaney [name], [phone], Girlfriend      Has patient been referred to the Quitline?: Patient refused referral for treatment  Patient has been referred for addiction treatment: Yes, the patient will follow up with an outpatient provider for substance use disorder. Psychiatrist/APP: appointment made and Therapist: appointment made  Alveta CHRISTELLA Kerns, LCSW 10/17/2024, 11:58 AM

## 2024-10-17 NOTE — Discharge Summary (Signed)
 Physician Discharge Summary Note  Patient:  Benjamin Shields is an 53 y.o., male MRN:  [id number] DOB:  15-Oct-1971 Patient phone:  [phone] (home)  Patient address:   [address] [place] [address],   Total time spent: 40 min Date of Admission:  10/10/2024 Date of Discharge: 10/17/24  Reason for Admission:  Patient is a 53 y.o. male admitted: Presented to the EDfor 10/10/2024  3:49 AM for According to EMS and his girlfriend on scene he had some electrical cord wrapped around his neck. She saw him step off of a stool at which point the cord got tight but ultimately snapped and he fell . He reports a past psychiatric diagnoses of MDD and anxiety but no significant medical history.    Principal Problem: MDD (major depressive disorder), severe (HCC) Discharge Diagnoses: Principal Problem:   MDD (major depressive disorder), severe (HCC) Active Problems:   Suicide attempt (HCC)   Hanging, initial encounter   Lethargy   Somnolence- suspect medication related/Acute toxic encephalopathy   Past Psychiatric History: see h&p  Family Psychiatric  History: see h&p Social History:  Social History   Substance and Sexual Activity  Alcohol Use Yes   Comment: rarely     Social History   Substance and Sexual Activity  Drug Use Yes   Types: Marijuana, Amphetamines, Cocaine    Social History   Socioeconomic History   Marital status: Single    Spouse name: Not on file   Number of children: Not on file   Years of education: Not on file   Highest education level: Not on file  Occupational History   Not on file  Tobacco Use   Smoking status: Some Days    Types: Cigarettes   Smokeless tobacco: Never  Vaping Use   Vaping status: Some Days  Substance and Sexual Activity   Alcohol use: Yes    Comment: rarely   Drug use: Yes    Types: Marijuana, Amphetamines, Cocaine   Sexual activity: Not on file  Other Topics Concern   Not on file  Social History Narrative    Not on file   Social Drivers of Health   Financial Resource Strain: Not on file  Food Insecurity: No Food Insecurity (10/10/2024)   Hunger Vital Sign    Worried About Running Out of Food in the Last Year: Never true    Ran Out of Food in the Last Year: Never true  Transportation Needs: No Transportation Needs (10/10/2024)   PRAPARE - Administrator, Civil Service (Medical): No    Lack of Transportation (Non-Medical): No  Physical Activity: Not on file  Stress: Not on file  Social Connections: Unknown (05/13/2022)   [facility] from [facility]   Social Network    Social Network: Not on file   Past Medical History:  Past Medical History:  Diagnosis Date   Asthma    Diabetes mellitus without complication (HCC)    High cholesterol    Hypertension    Pericarditis    Thyroid disease     Past Surgical History:  Procedure Laterality Date   VARICOSE VEIN SURGERY     Family History: History reviewed. No pertinent family history.  Hospital Course:  Patient is a 53 y.o. male admitted: Presented to the EDfor 10/10/2024  3:49 AM for According to EMS and his girlfriend on scene he had some electrical cord wrapped around his neck. She saw him step off of a stool at which point the cord  got tight but ultimately snapped and he fell . He reports a past psychiatric diagnoses of MDD and anxiety but no significant medical history.   Detailed risk assessment is complete based on clinical exam and individual risk factors and acute suicide risk is low and acute violence risk is low.   On admission patient is noted to be very irritable and hostile, minimizing his symptoms and refusing to work with the treatment team.  After the family meeting with his girlfriend and the team patient eventually agreed to try Risperdal 0.5 mg twice daily for mood stabilization.  Patient had low complexity regarding the alcohol withdrawal.  On the day of discharge patient denies SI/HI/plan and denies  hallucinations. Currently, all modifiable risk of harm to self/harm to others have been addressed and patient is no longer appropriate for the acute inpatient setting and is able to continue treatment for mental health needs in the community with the supports as indicated below.  Patient is educated and verbalized understanding of discharge plan of care including medications, follow-up appointments, mental health resources and further crisis services in the community.  He is instructed to call 911 or present to the nearest emergency room should he experience any decompensation in mood, disturbance of bowel or return of suicidal/homicidal ideations.  Patient verbalizes understanding of this education and agrees to this plan of care  Physical Findings: AIMS:  , ,  ,  ,    CIWA:    COWS:        Psychiatric Specialty Exam:  Presentation  General Appearance:  Appropriate for Environment  Eye Contact: Fair  Speech: Clear and Coherent  Speech Volume: Normal    Mood and Affect  Mood: Euthymic  Affect: Appropriate   Thought Process  Thought Processes: Coherent  Descriptions of Associations:Intact  Orientation:Full (Time, Place and Person)  Thought Content:Logical  Hallucinations:Hallucinations: None  Ideas of Reference:None  Suicidal Thoughts:Suicidal Thoughts: No  Homicidal Thoughts:Homicidal Thoughts: No   Sensorium  Memory: Immediate Fair; Recent Fair  Judgment: Fair  Insight: Fair   Art therapist  Concentration: Fair  Attention Span: Fair  Recall: [employer] of Knowledge: Fair  Language: Fair   Psychomotor Activity  Psychomotor Activity: Psychomotor Activity: Normal  Musculoskeletal: Strength & Muscle Tone: within normal limits Gait & Station: normal Assets  Assets: Manufacturing systems engineer; Desire for Improvement; Resilience   Sleep  Sleep: Sleep: Fair    Physical Exam: Physical Exam Vitals and nursing note reviewed.     ROS Blood pressure 98/73, pulse 70, temperature 97.7 F (36.5 C), temperature source Oral, resp. rate 16, height 5' 10 (1.778 m), weight 62.6 kg, SpO2 100%. Body mass index is 19.8 kg/m.   Social History   Tobacco Use  Smoking Status Some Days   Types: Cigarettes  Smokeless Tobacco Never   Tobacco Cessation:  A prescription for an FDA-approved tobacco cessation medication provided at discharge   Blood Alcohol level:  Lab Results  Component Value Date   [facility] <15 10/10/2024    Metabolic Disorder Labs:  No results found for: HGBA1C, MPG No results found for: PROLACTIN No results found for: CHOL, TRIG, HDL, CHOLHDL, VLDL, LDLCALC  See Psychiatric Specialty Exam and Suicide Risk Assessment completed by Attending Physician prior to discharge.  Discharge destination:  Home  Is patient on multiple antipsychotic therapies at discharge:  No   Has Patient had three or more failed trials of antipsychotic monotherapy by history:  No  Recommended Plan for Multiple Antipsychotic Therapies: NA  Allergies as of 10/17/2024       Reactions   Banana    Cat Dander Swelling   Penicillins Hives   Pt states he can take amoxicillin  without complication        Medication List     STOP taking these medications    hydrOXYzine  10 MG tablet Commonly known as: ATARAX    OLANZapine  5 MG tablet Commonly known as: ZyPREXA        TAKE these medications      Indication  albuterol  108 (90 Base) MCG/ACT inhaler Commonly known as: VENTOLIN  HFA Inhale 2 puffs into the lungs every 6 (six) hours as needed for wheezing or shortness of breath.  Indication: Asthma   cyclobenzaprine  10 MG tablet Commonly known as: FLEXERIL  Take 1 tablet (10 mg total) by mouth 2 (two) times daily as needed for muscle spasms.  Indication: Muscle Spasm   meloxicam  15 MG tablet Commonly known as: MOBIC  TAKE 1 TABLET(15 MG) BY MOUTH DAILY What changed: See the new instructions.   Indication: Joint Damage causing Pain and Loss of Function   nicotine 21 mg/24hr patch Commonly known as: NICODERM CQ - dosed in mg/24 hours Place 1 patch (21 mg total) onto the skin daily. Start taking on: October 18, 2024  Indication: Nicotine Addiction   risperiDONE 0.5 MG tablet Commonly known as: RISPERDAL Take 1 tablet (0.5 mg total) by mouth 2 (two) times daily. What changed:  medication strength how much to take when to take this  Indication: Agitated Movements Accompanied by Emotional Distress        Follow-up Information     [facility] [place]. Go to.   Why: In person psychiatry appointment is 10/21/24 at 10 AM with your provider, Jackee Sharps PA-C. Contact information: [address] [place], [place] [address]  Phone: [phone] Fax: [phone]        [facility] Follow up.   Why: Virtual therapy assessment is 10//25 at . Contact information: [address]  [address] [place] [address] [phone]                 Follow-up recommendations:  Activity:  As tolerated    Signed: Darina Hartwell, MD 10/17/2024, 12:12 PM

## 2024-10-17 NOTE — Group Note (Signed)
 Recreation Therapy Group Note   Group Topic:General Recreation  Group Date: 10/17/2024 Start Time: 1055 End Time: 1135 Facilitators: Celestia Jeoffrey BRAVO, LRT, CTRS Location: Courtyard  Group Description: [employer]. LRT and patients played games of basketball, drew with chalk, and played corn hole while outside in the courtyard while getting fresh air and sunlight. Music was being played in the background. LRT and peers conversed about different games they have played before, what they do in their free time and anything else that is on their minds. LRT encouraged pts to drink water after being outside, sweating and getting their heart rate up.  Goal Area(s) Addressed: Patient will build on frustration tolerance skills. Patients will partake in a competitive play game with peers. Patients will gain knowledge of new leisure interest/hobby.    Affect/Mood: N/A   Participation Level: Did not attend    Clinical Observations/Individualized Feedback: Patient did not attend group.   Plan: Continue to engage patient in RT group sessions 2-3x/week.   Jeoffrey BRAVO Celestia, LRT, CTRS 10/17/2024 12:54 PM

## 2024-10-17 NOTE — Progress Notes (Signed)
   10/17/24 1200  Psych Admission Type (Psych Patients Only)  Admission Status Involuntary  Psychosocial Assessment  Patient Complaints None  Eye Contact Fair  Facial Expression Anxious  Affect Appropriate to circumstance  Speech Logical/coherent  Interaction Assertive  Motor Activity Slow  Appearance/Hygiene Unremarkable  Behavior Characteristics Cooperative;Appropriate to situation  Mood Pleasant  Aggressive Behavior  Effect No apparent injury  Thought Process  Coherency WDL  Content WDL  Delusions None reported or observed  Perception WDL  Hallucination None reported or observed  Judgment WDL  Confusion None  Danger to Self  Current suicidal ideation? Denies  Danger to Others  Danger to Others None reported or observed

## 2024-10-17 NOTE — Group Note (Signed)
 Date:  10/17/2024 Time:  7:38 AM  Group Topic/Focus:  Personal Choices and Values:   The focus of this group is to help patients assess and explore the importance of values in their lives, how their values affect their decisions, how they express their values and what opposes their expression. Self Care:   The focus of this group is to help patients understand the importance of self-care in order to improve or restore emotional, physical, spiritual, interpersonal, and financial health. Self Esteem Action Plan:   The focus of this group is to help patients create a plan to continue to build self-esteem after discharge. Wrap-Up Group:   The focus of this group is to help patients review their daily goal of treatment and discuss progress on daily workbooks.    Participation Level:  Active  Participation Quality:  Appropriate  Affect:  Appropriate  Cognitive:  Alert and Appropriate  Insight: Appropriate and Good  Engagement in Group:  Engaged  Modes of Intervention:  Discussion and Support  Additional Comments:  N/A  Butler LITTIE Gelineau 10/17/2024, 7:38 AM

## 2024-10-17 NOTE — Plan of Care (Signed)

## 2024-11-02 ENCOUNTER — Telehealth: Payer: Self-pay | Admitting: Family Medicine

## 2024-11-02 NOTE — Telephone Encounter (Signed)
 Spoke to patient and informed him that recent lab results were printed and left at the front desk for pick up. Pt verbalized understanding.

## 2024-11-02 NOTE — Telephone Encounter (Signed)
 Copied from CRM [id number]. Topic: General - Other >> Nov 01, 2024  4:28 PM Alexandria E wrote: Reason for CRM: Patient's girlfriend, Onalee, called in and she would like a print off of the labs that were completed on 10/10/2024. Please call Tamisha when these are ready for pick up.

## 2024-12-09 ENCOUNTER — Other Ambulatory Visit: Payer: Self-pay

## 2024-12-09 ENCOUNTER — Emergency Department (HOSPITAL_BASED_OUTPATIENT_CLINIC_OR_DEPARTMENT_OTHER): Payer: MEDICAID | Admitting: Radiology

## 2024-12-09 ENCOUNTER — Emergency Department (HOSPITAL_BASED_OUTPATIENT_CLINIC_OR_DEPARTMENT_OTHER)
Admission: EM | Admit: 2024-12-09 | Discharge: 2024-12-09 | Disposition: A | Discharge status: Home / Self Care | Payer: MEDICAID | Attending: Emergency Medicine | Admitting: Emergency Medicine

## 2024-12-09 ENCOUNTER — Encounter (HOSPITAL_BASED_OUTPATIENT_CLINIC_OR_DEPARTMENT_OTHER): Payer: Self-pay

## 2024-12-09 DIAGNOSIS — S61214A Laceration without foreign body of right ring finger without damage to nail, initial encounter: Secondary | ICD-10-CM | POA: Insufficient documentation

## 2024-12-09 DIAGNOSIS — W268XXA Contact with other sharp object(s), not elsewhere classified, initial encounter: Secondary | ICD-10-CM | POA: Insufficient documentation

## 2024-12-09 MED ORDER — CEPHALEXIN 250 MG PO CAPS
500.0000 mg | ORAL_CAPSULE | Freq: Once | ORAL | Status: AC
  Administered 2024-12-09: 500 mg via ORAL
  Filled 2024-12-09: qty 2

## 2024-12-09 MED ORDER — CEPHALEXIN 500 MG PO CAPS: 500.0000 mg | ORAL_CAPSULE | Freq: Four times a day (QID) | ORAL | 0 refills | Status: AC

## 2024-12-09 MED ORDER — BACITRACIN ZINC 500 UNIT/GM EX OINT: TOPICAL_OINTMENT | Freq: Two times a day (BID) | CUTANEOUS | Status: DC

## 2024-12-09 NOTE — ED Provider Notes (Addendum)
 [facility] AT [facility] Provider Note   CSN: [id number] Arrival date & time: 12/09/24  1729     Patient presents with: Laceration   Benjamin Shields is a 53 y.o. male presents today for right ring finger laceration.  Patient reports he cut his finger last night with a broken vase.  Patient reports numerous superficial lacerations and deeper laceration noted to the palmar surface of the distal phalanx right ring finger.  Patient's last Tdap was in March.    Laceration      Prior to Admission medications  Medication Sig Start Date End Date Taking? Authorizing Provider  cephALEXin (KEFLEX) 500 MG capsule Take 1 capsule (500 mg total) by mouth 4 (four) times daily. 12/09/24  Yes Donta Fuster N, PA-C  albuterol  (VENTOLIN  HFA) 108 (90 Base) MCG/ACT inhaler Inhale 2 puffs into the lungs every 6 (six) hours as needed for wheezing or shortness of breath. 02/02/24   Sebastian Beverley NOVAK, MD  cyclobenzaprine  (FLEXERIL ) 10 MG tablet Take 1 tablet (10 mg total) by mouth 2 (two) times daily as needed for muscle spasms. 06/03/24   Silver Wonda LABOR, PA  meloxicam  (MOBIC ) 15 MG tablet TAKE 1 TABLET(15 MG) BY MOUTH DAILY Patient taking differently: Take 15 mg by mouth daily as needed for pain. 07/14/24   Sebastian Beverley NOVAK, MD  nicotine  (NICODERM CQ  - DOSED IN MG/24 HOURS) 21 mg/24hr patch Place 1 patch (21 mg total) onto the skin daily. 10/18/24   Donnelly Mellow, MD  risperiDONE  (RISPERDAL ) 0.5 MG tablet Take 1 tablet (0.5 mg total) by mouth 2 (two) times daily. 10/17/24   Donnelly Mellow, MD    Allergies: Banana, Cat dander, and Penicillins    Review of Systems  Skin:  Positive for wound.    Updated Vital Signs BP 115/84 (BP Location: Left Arm)   Pulse 93   Temp 97.7 F (36.5 C)   Resp 15   Ht 5' 9 (1.753 m)   Wt 71.7 kg   BMI 23.33 kg/m   Physical Exam Vitals and nursing note reviewed.  Constitutional:      General: He is not in acute distress.     Appearance: He is well-developed. He is not toxic-appearing.  HENT:     Head: Normocephalic and atraumatic.     Right Ear: External ear normal.     Left Ear: External ear normal.     Nose: Nose normal.  Eyes:     Conjunctiva/sclera: Conjunctivae normal.  Cardiovascular:     Rate and Rhythm: Normal rate and regular rhythm.     Pulses: Normal pulses.     Heart sounds: Normal heart sounds.  Pulmonary:     Effort: Pulmonary effort is normal. No respiratory distress.     Breath sounds: Normal breath sounds.  Abdominal:     Palpations: Abdomen is soft.     Tenderness: There is no abdominal tenderness.  Musculoskeletal:        General: Signs of injury present. No swelling.     Cervical back: Neck supple.  Skin:    General: Skin is warm and dry.     Capillary Refill: Capillary refill takes less than 2 seconds.     Comments: Patient with numerous superficial, hemostatic lacerations to the palm of the right hand.  Patient also has deeper avulsion type laceration noted to the distal palmar surface of the right ring finger.  This wound is also hemostatic.  Patient is neurovascularly intact.  Range of motion mildly  limited secondary to pain.  There is no nailbed involvement.  There is no warmth, erythema, or purulent discharge noted on exam.  Neurological:     General: No focal deficit present.     Mental Status: He is alert and oriented to person, place, and time.  Psychiatric:        Mood and Affect: Mood normal.     (all labs ordered are listed, but only abnormal results are displayed) Labs Reviewed - No data to display  EKG: None  Radiology: No results found.   Procedures   Medications Ordered in the ED  bacitracin ointment (has no administration in time range)  cephALEXin (KEFLEX) capsule 500 mg (500 mg Oral Given 12/09/24 1754)                                    Medical Decision Making Amount and/or Complexity of Data Reviewed Radiology: ordered.  Risk OTC  drugs. Prescription drug management.   lalThis patient presents to the ED for concern of laceration differential diagnosis includes laceration, avulsion, fracture, dislocation, open fracture   Imaging Studies ordered:  I ordered imaging studies including right hand x-ray I independently visualized and interpreted imaging which showed Negative for fracture/dislocation. Small superficial punctate FB in ring finger I agree with the radiologist interpretation   Medicines ordered and prescription drug management:  I ordered medication including Keflex    I have reviewed the patients home medicines and have made adjustments as needed   Problem List / ED Course:  Considered for admission or further workup however patient's vital signs, physical exam, and imaging are reassuring.  Given that the injury occurred approximately 24 hours ago, no laceration repair was performed.  Wound was well irrigated in emergency department after imaging and patient was placed on course of Keflex outpatient for infection prevention.  Given that I was unable to assess full ROM, patient to follow-up with Dr. Murrell with hand surgery for further evaluation and workup.  I feel patient is safe for discharge at this time.       Final diagnoses:  Laceration of right ring finger without damage to nail, foreign body presence unspecified, initial encounter    ED Discharge Orders          Ordered    cephALEXin (KEFLEX) 500 MG capsule  4 times daily        12/09/24 1751               Francis Ileana SAILOR, PA-C 12/09/24 1818    Francis Ileana SAILOR, PA-C 12/09/24 1905    Ruthe Cornet, DO 12/09/24 1919

## 2024-12-09 NOTE — ED Triage Notes (Signed)
 Pt reports cutting R finger laceration to ring finger when cut with broken vase last PM.

## 2024-12-09 NOTE — Discharge Instructions (Addendum)
 Today you were seen for a right ring finger laceration.  This wound has been left open.  Please pick up your Keflex and take as prescribed.  You may alternate Tylenol /Motrin as needed for pain.  Please follow-up with Dr. Murrell with hand surgery for further evaluation and workup.  Thank you for letting us  treat you today. After reviewing your imaging, I feel you are safe to go home. Please follow up with your PCP in the next several days and provide them with your records from this visit. Return to the Emergency Room if pain becomes severe or symptoms worsen.

## 2025-03-27 ENCOUNTER — Encounter: Payer: MEDICAID | Admitting: Family Medicine

## 2025-03-29 ENCOUNTER — Encounter: Payer: MEDICAID | Admitting: Family Medicine
